# Patient Record
Sex: Male | Born: 1960 | Race: White | Hispanic: No | Marital: Married | Smoking: Former smoker
Health system: Southern US, Community
[De-identification: ages and names within clinical notes are randomized; demographics above are authoritative.]

## PROBLEM LIST (undated history)

## (undated) DIAGNOSIS — I10 Essential (primary) hypertension: Secondary | ICD-10-CM

## (undated) DIAGNOSIS — E079 Disorder of thyroid, unspecified: Secondary | ICD-10-CM

## (undated) DIAGNOSIS — H8109 Meniere's disease, unspecified ear: Secondary | ICD-10-CM

## (undated) DIAGNOSIS — M1990 Unspecified osteoarthritis, unspecified site: Secondary | ICD-10-CM

## (undated) DIAGNOSIS — E7800 Pure hypercholesterolemia, unspecified: Secondary | ICD-10-CM

## (undated) DIAGNOSIS — K219 Gastro-esophageal reflux disease without esophagitis: Secondary | ICD-10-CM

## (undated) DIAGNOSIS — F419 Anxiety disorder, unspecified: Secondary | ICD-10-CM

## (undated) HISTORY — DX: Gastro-esophageal reflux disease without esophagitis: K21.9

## (undated) HISTORY — PX: VASECTOMY: SHX75

## (undated) HISTORY — DX: Disorder of thyroid, unspecified: E07.9

## (undated) HISTORY — PX: HERNIA REPAIR: SHX51

## (undated) HISTORY — DX: Anxiety disorder, unspecified: F41.9

## (undated) HISTORY — DX: Unspecified osteoarthritis, unspecified site: M19.90

---

## 2021-08-20 LAB — CBC AND DIFFERENTIAL
HCT: 44 (ref 41–53)
Hemoglobin: 14.6 (ref 13.5–17.5)
Neutrophils Absolute: 4.3
Platelets: 210 10*3/uL (ref 150–400)
WBC: 7.1

## 2021-08-20 LAB — LIPID PANEL
Cholesterol: 171 (ref 0–200)
HDL: 40 (ref 35–70)
LDL Cholesterol: 118
Triglycerides: 64 (ref 40–160)

## 2021-08-20 LAB — COMPREHENSIVE METABOLIC PANEL
Albumin: 4.1 (ref 3.5–5.0)
Calcium: 9.3 (ref 8.7–10.7)
eGFR: 87

## 2021-08-20 LAB — HEPATIC FUNCTION PANEL
ALT: 10 U/L (ref 10–40)
AST: 13 — AB (ref 14–40)
Alkaline Phosphatase: 56 (ref 25–125)
Bilirubin, Total: 0.4

## 2021-08-20 LAB — BASIC METABOLIC PANEL
BUN: 16 (ref 4–21)
CO2: 24 — AB (ref 13–22)
Chloride: 105 (ref 99–108)
Creatinine: 0.9 (ref 0.6–1.3)
Glucose: 104
Potassium: 3.8 mEq/L (ref 3.5–5.1)
Sodium: 140 (ref 137–147)

## 2021-08-20 LAB — CBC: RBC: 5.27 — AB (ref 3.87–5.11)

## 2021-10-15 ENCOUNTER — Observation Stay (HOSPITAL_COMMUNITY)
Admission: EM | Admit: 2021-10-15 | Discharge: 2021-10-17 | Disposition: A | Discharge status: Home / Self Care | Payer: Medicare Other | Attending: Cardiology | Admitting: Cardiology

## 2021-10-15 ENCOUNTER — Emergency Department (HOSPITAL_COMMUNITY): Payer: Medicare Other

## 2021-10-15 ENCOUNTER — Other Ambulatory Visit: Payer: Self-pay

## 2021-10-15 ENCOUNTER — Encounter (HOSPITAL_COMMUNITY): Payer: Self-pay

## 2021-10-15 DIAGNOSIS — R0600 Dyspnea, unspecified: Secondary | ICD-10-CM | POA: Insufficient documentation

## 2021-10-15 DIAGNOSIS — N179 Acute kidney failure, unspecified: Secondary | ICD-10-CM | POA: Diagnosis not present

## 2021-10-15 DIAGNOSIS — Z87891 Personal history of nicotine dependence: Secondary | ICD-10-CM | POA: Insufficient documentation

## 2021-10-15 DIAGNOSIS — R0602 Shortness of breath: Secondary | ICD-10-CM | POA: Diagnosis present

## 2021-10-15 DIAGNOSIS — I7121 Aneurysm of the ascending aorta, without rupture: Secondary | ICD-10-CM | POA: Insufficient documentation

## 2021-10-15 DIAGNOSIS — I10 Essential (primary) hypertension: Secondary | ICD-10-CM | POA: Insufficient documentation

## 2021-10-15 DIAGNOSIS — E876 Hypokalemia: Secondary | ICD-10-CM | POA: Diagnosis not present

## 2021-10-15 DIAGNOSIS — Z79899 Other long term (current) drug therapy: Secondary | ICD-10-CM | POA: Diagnosis not present

## 2021-10-15 DIAGNOSIS — E0590 Thyrotoxicosis, unspecified without thyrotoxic crisis or storm: Secondary | ICD-10-CM | POA: Diagnosis not present

## 2021-10-15 DIAGNOSIS — Z20822 Contact with and (suspected) exposure to covid-19: Secondary | ICD-10-CM | POA: Insufficient documentation

## 2021-10-15 DIAGNOSIS — R7303 Prediabetes: Secondary | ICD-10-CM | POA: Diagnosis not present

## 2021-10-15 DIAGNOSIS — R0609 Other forms of dyspnea: Secondary | ICD-10-CM | POA: Diagnosis present

## 2021-10-15 DIAGNOSIS — I200 Unstable angina: Secondary | ICD-10-CM

## 2021-10-15 DIAGNOSIS — R002 Palpitations: Secondary | ICD-10-CM

## 2021-10-15 DIAGNOSIS — Z7982 Long term (current) use of aspirin: Secondary | ICD-10-CM | POA: Insufficient documentation

## 2021-10-15 HISTORY — DX: Essential (primary) hypertension: I10

## 2021-10-15 HISTORY — DX: Pure hypercholesterolemia, unspecified: E78.00

## 2021-10-15 LAB — HEPATIC FUNCTION PANEL
ALT: 22 U/L (ref 0–44)
AST: 26 U/L (ref 15–41)
Albumin: 3.6 g/dL (ref 3.5–5.0)
Alkaline Phosphatase: 58 U/L (ref 38–126)
Bilirubin, Direct: 0.2 mg/dL (ref 0.0–0.2)
Indirect Bilirubin: 0.7 mg/dL (ref 0.3–0.9)
Total Bilirubin: 0.9 mg/dL (ref 0.3–1.2)
Total Protein: 6.8 g/dL (ref 6.5–8.1)

## 2021-10-15 LAB — BASIC METABOLIC PANEL
Anion gap: 10 (ref 5–15)
BUN: 29 mg/dL — ABNORMAL HIGH (ref 8–23)
CO2: 23 mmol/L (ref 22–32)
Calcium: 9.5 mg/dL (ref 8.9–10.3)
Chloride: 106 mmol/L (ref 98–111)
Creatinine, Ser: 1.41 mg/dL — ABNORMAL HIGH (ref 0.61–1.24)
GFR, Estimated: 56 mL/min — ABNORMAL LOW (ref >60)
Glucose, Bld: 119 mg/dL — ABNORMAL HIGH (ref 70–99)
Potassium: 3.3 mmol/L — ABNORMAL LOW (ref 3.5–5.1)
Sodium: 139 mmol/L (ref 135–145)

## 2021-10-15 LAB — URINALYSIS, ROUTINE W REFLEX MICROSCOPIC
Bilirubin Urine: NEGATIVE
Glucose, UA: NEGATIVE mg/dL
Hgb urine dipstick: NEGATIVE
Ketones, ur: 5 mg/dL — AB
Leukocytes,Ua: NEGATIVE
Nitrite: NEGATIVE
Protein, ur: 30 mg/dL — AB
Specific Gravity, Urine: 1.028 (ref 1.005–1.030)
pH: 5 (ref 5.0–8.0)

## 2021-10-15 LAB — CBC
HCT: 43.2 % (ref 39.0–52.0)
Hemoglobin: 14.6 g/dL (ref 13.0–17.0)
MCH: 27.4 pg (ref 26.0–34.0)
MCHC: 33.8 g/dL (ref 30.0–36.0)
MCV: 81.2 fL (ref 80.0–100.0)
Platelets: 338 10*3/uL (ref 150–400)
RBC: 5.32 MIL/uL (ref 4.22–5.81)
RDW: 12.7 % (ref 11.5–15.5)
WBC: 8.4 10*3/uL (ref 4.0–10.5)
nRBC: 0 % (ref 0.0–0.2)

## 2021-10-15 LAB — PROTIME-INR
INR: 1.1 (ref 0.8–1.2)
Prothrombin Time: 14.6 seconds (ref 11.4–15.2)

## 2021-10-15 LAB — MAGNESIUM: Magnesium: 1.8 mg/dL (ref 1.7–2.4)

## 2021-10-15 LAB — BRAIN NATRIURETIC PEPTIDE: B Natriuretic Peptide: 70.8 pg/mL (ref 0.0–100.0)

## 2021-10-15 LAB — TROPONIN I (HIGH SENSITIVITY)
Troponin I (High Sensitivity): 24 ng/L — ABNORMAL HIGH (ref <18)
Troponin I (High Sensitivity): 23 ng/L — ABNORMAL HIGH (ref <18)

## 2021-10-15 LAB — RESP PANEL BY RT-PCR (FLU A&B, COVID) ARPGX2
Influenza A by PCR: NEGATIVE
Influenza B by PCR: NEGATIVE
SARS Coronavirus 2 by RT PCR: NEGATIVE

## 2021-10-15 LAB — LIPASE, BLOOD: Lipase: 80 U/L — ABNORMAL HIGH (ref 11–51)

## 2021-10-15 MED ORDER — SODIUM CHLORIDE 0.9 % IV SOLN: Freq: Once | INTRAVENOUS | Status: AC

## 2021-10-15 MED ORDER — HEPARIN BOLUS VIA INFUSION
4000.0000 [IU] | Freq: Once | INTRAVENOUS | Status: AC
  Administered 2021-10-15: 4000 [IU] via INTRAVENOUS
  Filled 2021-10-15: qty 4000

## 2021-10-15 MED ORDER — HEPARIN (PORCINE) 25000 UT/250ML-% IV SOLN
1500.0000 [IU]/h | INTRAVENOUS | Status: DC
  Administered 2021-10-15: 1300 [IU]/h via INTRAVENOUS
  Filled 2021-10-15 (×2): qty 250

## 2021-10-15 MED ORDER — POTASSIUM CHLORIDE CRYS ER 20 MEQ PO TBCR
40.0000 meq | EXTENDED_RELEASE_TABLET | Freq: Once | ORAL | Status: AC
  Administered 2021-10-15: 40 meq via ORAL
  Filled 2021-10-15: qty 2

## 2021-10-15 NOTE — ED Triage Notes (Signed)
Pt arrived POV from home c/o palpitations and SHOB x1-2 weeks. Pt states he also has felt dizzy and fatigued.  ?

## 2021-10-15 NOTE — ED Provider Notes (Signed)
?[facility] EM[facility]ENC[facility] ?Provider Note ? ? ?CSN: [id number] ?Arrival date & time: 10/15/21  1357 ? ?  ? ?History ? ?Chief Complaint  ?Patient presents with  ? Palpitations  ? Shortness of Breath  ? ? ?James Andersen is a 61 y.o. male with a past medical history of elevated BMI, hypertension, hyperlipidemia, 40-pack-year smoking history but quit 6 years ago who presents the emergency department chief complaint of exertional dyspnea.  Patient reports that for the past week he has noticed increasingly worsening exertional dyspnea.  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  He would also noticed palpitations which were new.  Patient reports that for instance yesterday he went [facility] and when he walked up a second flight of stairs he became extremely dizzy, short of breath, extremely nauseous, covered in cold sweat and had to sit down to improve his symptoms.  Patient also reports that his son-in-law who is a Psychologist, forensicCath Lab nurse was helping him build a lean to and he noted that he was becoming really short of breath whenever he would have to exert himself and told him that he really needed to get checked out.  He denies orthopnea, PND, unilateral or bilateral peripheral edema.  He has no family history of primarily relative with MI.  He denies a history of diabetes.  Patient does not drink alcohol regularly. ? ? ?Palpitations ?Associated symptoms: shortness of breath   ?Shortness of Breath ? ?HPI: A 61 year old patient with a history of hypertension, hypercholesterolemia and obesity presents for evaluation of chest pain. Initial onset of pain was approximately 3-6 hours ago. The patient's chest pain is worse with exertion. The patient complains of nausea and reports some diaphoresis. The patient's chest pain is middle- or  left-sided, is not well-localized, is not described as heaviness/pressure/tightness, is not sharp and does not radiate to the arms/jaw/neck. The patient has no history of stroke, has no history of peripheral artery disease, has not smoked in the past 90 days, denies any history of treated diabetes and has no relevant family history of coronary artery disease (first degree relative at less than age 455).  ? ?Home Medications ?Prior to Admission medications   ?Not on File  ?   ? ?Allergies    ?Patient has no known allergies.   ? ?Review of Systems   ?Review of Systems  ?Respiratory:  Positive for shortness of breath.   ?Cardiovascular:  Positive for palpitations.  ? ?Physical Exam ?Updated Vital Signs ?BP 136/81   Pulse 66   Temp 97.8 ?F (36.6 ?C) (Oral)   Resp 18   Ht 5\' 8"  (1.727 m)   Wt 106.6 kg   SpO2 99%   BMI 35.73 kg/m?  ?Physical Exam ?Vitals and nursing note reviewed.  ?Constitutional:   ?   General: He is not in acute distress. ?   Appearance: He is well-developed. He is not diaphoretic.  ?HENT:  ?   Head: Normocephalic and atraumatic.  ?Eyes:  ?   General: No scleral icterus. ?   Conjunctiva/sclera: Conjunctivae normal.  ?Cardiovascular:  ?   Rate and Rhythm: Normal rate and regular rhythm.  ?   Heart sounds: Normal heart sounds.  ?Pulmonary:  ?   Effort: Pulmonary effort is normal. No tachypnea or respiratory distress.  ?  Breath sounds: Normal breath sounds.  ?Abdominal:  ?   Palpations: Abdomen is soft.  ?   Tenderness: There is no abdominal tenderness.  ?Musculoskeletal:  ?   Cervical back: Normal range of motion and neck supple.  ?   Right lower leg: No edema.  ?   Left lower leg: No edema.  ?Skin: ?   General: Skin is warm and dry.  ?Neurological:  ?   Mental Status: He is alert.  ?Psychiatric:     ?   Behavior: Behavior normal.  ? ? ?ED Results / Procedures / Treatments   ?Labs ?(all labs ordered are listed, but only abnormal results are displayed) ?Labs Reviewed  ?BASIC METABOLIC PANEL -  Abnormal; Notable for the following components:  ?    Result Value  ? Potassium 3.3 (*)   ? Glucose, Bld 119 (*)   ? BUN 29 (*)   ? Creatinine, Ser 1.41 (*)   ? GFR, Estimated 56 (*)   ? All other components within normal limits  ?LIPASE, BLOOD - Abnormal; Notable for the following components:  ? Lipase 80 (*)   ? All other components within normal limits  ?URINALYSIS, ROUTINE W REFLEX MICROSCOPIC - Abnormal; Notable for the following components:  ? Color, Urine AMBER (*)   ? APPearance CLOUDY (*)   ? Ketones, ur 5 (*)   ? Protein, ur 30 (*)   ? Bacteria, UA FEW (*)   ? All other components within normal limits  ?TROPONIN I (HIGH SENSITIVITY) - Abnormal; Notable for the following components:  ? Troponin I (High Sensitivity) 23 (*)   ? All other components within normal limits  ?TROPONIN I (HIGH SENSITIVITY) - Abnormal; Notable for the following components:  ? Troponin I (High Sensitivity) 24 (*)   ? All other components within normal limits  ?RESP PANEL BY RT-PCR (FLU A&B, COVID) ARPGX2  ?CBC  ?MAGNESIUM  ?HEPATIC FUNCTION PANEL  ?BRAIN NATRIURETIC PEPTIDE  ?PROTIME-INR  ?TSH  ?HEPARIN LEVEL (UNFRACTIONATED)  ? ? ?EKG ?EKG Interpretation ? ?Date/Time:  Friday October 15 2021 14:11:21 EDT ?Ventricular Rate:  81 ?PR Interval:  160 ?QRS Duration: 84 ?QT Interval:  364 ?QTC Calculation: 422 ?R Axis:   -6 ?Text Interpretation: Sinus rhythm with marked sinus arrhythmia Nonspecific ST abnormality Abnormal ECG No previous ECGs available Confirmed by Pricilla Loveless [phone]) on 10/15/2021 9:48:20 PM ? ?Radiology ?DG Chest 2 View ? ?Result Date: 10/15/2021 ?CLINICAL DATA:  61 year old male with palpitations and shortness of breath. EXAM: CHEST - 2 VIEW COMPARISON:  None. FINDINGS: The mediastinal contours are within normal limits. No cardiomegaly. The lungs are clear bilaterally without evidence of focal consolidation, pleural effusion, or pneumothorax. No acute osseous abnormality. IMPRESSION: No acute cardiopulmonary process.  Electronically Signed   By: Marliss Coots M.D.   On: 10/15/2021 15:08   ? ?Procedures ?Marland KitchenCritical Care ?Performed by: Arthor Captain, PA-C ?Authorized by: Arthor Captain, PA-C  ? ?Critical care provider statement:  ?  Critical care time (minutes):  60 ?  Critical care time was exclusive of:  Separately billable procedures and treating other patients ?  Critical care was necessary to treat or prevent imminent or life-threatening deterioration of the following conditions:  Cardiac failure ?  Critical care was time spent personally by me on the following activities:  Development of treatment plan with patient or surrogate, discussions with consultants, evaluation of patient's response to treatment, examination of patient, ordering and review of laboratory studies, ordering and review of radiographic studies, ordering  and performing treatments and interventions, pulse oximetry, re-evaluation of patient's condition and review of old charts  ? ? ?Medications Ordered in ED ?Medications  ?heparin ADULT infusion 100 units/mL (25000 units/242mL) (1,300 Units/hr Intravenous New Bag/Given 10/15/21 2312)  ?0.9 %  sodium chloride infusion ( Intravenous New Bag/Given 10/15/21 2252)  ?potassium chloride SA (KLOR-CON M) CR tablet 40 mEq (40 mEq Oral Given 10/15/21 2254)  ?heparin bolus via infusion 4,000 Units (4,000 Units Intravenous Bolus from Bag 10/15/21 2312)  ? ? ?ED Course/ Medical Decision Making/ A&P ?  ?HEAR Score: 6                       ?Medical Decision Making ?Amount and/or Complexity of Data Reviewed ?Labs: ordered. ?Radiology: ordered. ? ? ? ? ? ?  ? ? ?This patient presents to the ED for concern of SOB/ palpitations, this involves an extensive number of treatment options, and is a complaint that carries with it a high risk of complications and morbidity.  The differential diagnosis includes The emergent differential diagnosis for shortness of breath includes, but is not limited to, Pulmonary edema, bronchoconstriction,  Pneumonia, Pulmonary embolism, Pneumotherax/ Hemothorax, Dysrythmia, ACS.  The differential diagnosis for palpitations includes cardiac arrhythmias, PVC/PAC, ACS, Cardiomyopathy, CHF, MVP, pericarditis, valvular

## 2021-10-15 NOTE — Progress Notes (Signed)
ANTICOAGULATION CONSULT NOTE - Initial Consult ? ?Pharmacy Consult for Heparin ?Indication: chest pain/ACS ? ?No Known Allergies ? ?Patient Measurements: ?Height: 5\' 8"  (172.7 cm) ?Weight: 106.6 kg (235 lb) ?IBW/kg (Calculated) : 68.4 ?Heparin Dosing Weight: 91.8 kg ? ?Vital Signs: ?Temp: 97.8 ?F (36.6 ?C) (03/24 1404) ?Temp Source: Oral (03/24 1404) ?BP: 136/81 (03/24 2114) ?Pulse Rate: 66 (03/24 2114) ? ?Labs: ?Recent Labs  ?  10/15/21 ?1357 10/15/21 ?1539 10/15/21 ?1541  ?HGB  --   --  14.6  ?HCT  --   --  43.2  ?PLT  --   --  338  ?CREATININE  --   --  1.41*  ?TROPONINIHS 24* 23*  --   ? ? ?Estimated Creatinine Clearance: 64.3 mL/min (A) (by C-G formula based on SCr of 1.41 mg/dL (H)). ? ? ?Medical History: ?Past Medical History:  ?Diagnosis Date  ? High cholesterol   ? Hypertension   ? ? ?Medications:  ?(Not in a hospital admission) ? ?Scheduled:  ? potassium chloride SA  40 mEq Oral Once  ? ?Infusions:  ? sodium chloride    ? ?PRN:  ? ?Assessment: ?59 yom with a history of HTN, HLD. Patient is presenting with palpitations & SOB. Heparin per pharmacy consult placed for chest pain/ACS. ? ?Patient is not on anticoagulation prior to arrival. ? ?Hgb 14.6; plt 338 ? ?Goal of Therapy:  ?Heparin level 0.3-0.7 units/ml ?Monitor platelets by anticoagulation protocol: Yes ?  ?Plan:  ?Give 4000 units bolus x 1 ?Start heparin infusion at 1300 units/hr ?Check anti-Xa level in 6 hours and daily while on heparin ?Continue to monitor H&H and platelets ? ?66, PharmD, BCPS ?10/15/2021 10:52 PM ?ED Clinical Pharmacist -  [phone] ? ?  ?

## 2021-10-15 NOTE — ED Provider Triage Note (Signed)
Emergency Medicine Provider Triage Evaluation Note ? ?James Andersen , a 61 y.o. male  was evaluated in triage.  Pt complains of multiple symptoms.  Most notably he started having palpitations several days ago.  He feels like his heart is rapidly beating and is irregular.  He says this is all the time.  He also has associated chest pressures.  He is in here with his wife reports he has had increased dizziness, shortness of breath.  Dizziness is mostly when he goes from a sitting to standing position.  He is having the palpitations now. ? ?Review of Systems  ?Positive: Chest pressure, palpitations, shortness of breath, dizziness, fatigue ?Negative:  ? ?Physical Exam  ?BP 127/83 (BP Location: Left Arm)   Pulse 71   Temp 97.8 ?F (36.6 ?C) (Oral)   Resp 17   Ht 5\' 8"  (1.727 m)   Wt 106.6 kg   SpO2 97%   BMI 35.73 kg/m?  ?Gen:   Awake, no distress   ?Resp:  Normal effort  ?MSK:   Moves extremities without difficulty  ?Other:   ? ?Medical Decision Making  ?Medically screening exam initiated at 2:36 PM.  Appropriate orders placed.  James Andersen was informed that the remainder of the evaluation will be completed by another provider, this initial triage assessment does not replace that evaluation, and the importance of remaining in the ED until their evaluation is complete. ? ? ?  ?Neomia Dear, PA-C ?10/15/21 1443 ? ?

## 2021-10-16 ENCOUNTER — Observation Stay (HOSPITAL_COMMUNITY): Payer: Medicare Other

## 2021-10-16 ENCOUNTER — Inpatient Hospital Stay (HOSPITAL_BASED_OUTPATIENT_CLINIC_OR_DEPARTMENT_OTHER): Payer: Medicare Other

## 2021-10-16 ENCOUNTER — Encounter (HOSPITAL_COMMUNITY): Payer: Self-pay | Admitting: Cardiology

## 2021-10-16 DIAGNOSIS — E0590 Thyrotoxicosis, unspecified without thyrotoxic crisis or storm: Principal | ICD-10-CM

## 2021-10-16 DIAGNOSIS — I200 Unstable angina: Secondary | ICD-10-CM

## 2021-10-16 DIAGNOSIS — R0609 Other forms of dyspnea: Secondary | ICD-10-CM | POA: Diagnosis present

## 2021-10-16 DIAGNOSIS — N179 Acute kidney failure, unspecified: Secondary | ICD-10-CM | POA: Diagnosis not present

## 2021-10-16 DIAGNOSIS — R0600 Dyspnea, unspecified: Secondary | ICD-10-CM | POA: Diagnosis not present

## 2021-10-16 DIAGNOSIS — Z20822 Contact with and (suspected) exposure to covid-19: Secondary | ICD-10-CM | POA: Diagnosis not present

## 2021-10-16 DIAGNOSIS — R0602 Shortness of breath: Secondary | ICD-10-CM | POA: Diagnosis present

## 2021-10-16 LAB — HIV ANTIBODY (ROUTINE TESTING W REFLEX): HIV Screen 4th Generation wRfx: NONREACTIVE

## 2021-10-16 LAB — HEPATIC FUNCTION PANEL
ALT: 22 U/L (ref 0–44)
AST: 21 U/L (ref 15–41)
Albumin: 3.3 g/dL — ABNORMAL LOW (ref 3.5–5.0)
Alkaline Phosphatase: 49 U/L (ref 38–126)
Bilirubin, Direct: 0.2 mg/dL (ref 0.0–0.2)
Indirect Bilirubin: 0.8 mg/dL (ref 0.3–0.9)
Total Bilirubin: 1 mg/dL (ref 0.3–1.2)
Total Protein: 6.1 g/dL — ABNORMAL LOW (ref 6.5–8.1)

## 2021-10-16 LAB — BASIC METABOLIC PANEL
Anion gap: 9 (ref 5–15)
BUN: 29 mg/dL — ABNORMAL HIGH (ref 8–23)
CO2: 22 mmol/L (ref 22–32)
Calcium: 8.8 mg/dL — ABNORMAL LOW (ref 8.9–10.3)
Chloride: 107 mmol/L (ref 98–111)
Creatinine, Ser: 1.1 mg/dL (ref 0.61–1.24)
GFR, Estimated: >60 mL/min (ref >60)
Glucose, Bld: 115 mg/dL — ABNORMAL HIGH (ref 70–99)
Potassium: 3 mmol/L — ABNORMAL LOW (ref 3.5–5.1)
Sodium: 138 mmol/L (ref 135–145)

## 2021-10-16 LAB — RAPID URINE DRUG SCREEN, HOSP PERFORMED
Amphetamines: NOT DETECTED
Barbiturates: NOT DETECTED
Benzodiazepines: POSITIVE — AB
Cocaine: NOT DETECTED
Opiates: NOT DETECTED
Tetrahydrocannabinol: POSITIVE — AB

## 2021-10-16 LAB — CBC
HCT: 37.9 % — ABNORMAL LOW (ref 39.0–52.0)
Hemoglobin: 12.8 g/dL — ABNORMAL LOW (ref 13.0–17.0)
MCH: 27.2 pg (ref 26.0–34.0)
MCHC: 33.8 g/dL (ref 30.0–36.0)
MCV: 80.6 fL (ref 80.0–100.0)
Platelets: 282 10*3/uL (ref 150–400)
RBC: 4.7 MIL/uL (ref 4.22–5.81)
RDW: 12.8 % (ref 11.5–15.5)
WBC: 6.8 10*3/uL (ref 4.0–10.5)
nRBC: 0 % (ref 0.0–0.2)

## 2021-10-16 LAB — CREATININE, URINE, RANDOM: Creatinine, Urine: 209.75 mg/dL

## 2021-10-16 LAB — ECHOCARDIOGRAM COMPLETE
Calc EF: 69.2 %
Height: 68 in
S' Lateral: 3.2 cm
Single Plane A2C EF: 60.3 %
Single Plane A4C EF: 76.7 %
Weight: 3568 oz

## 2021-10-16 LAB — HEPARIN LEVEL (UNFRACTIONATED): Heparin Unfractionated: 0.18 IU/mL — ABNORMAL LOW (ref 0.30–0.70)

## 2021-10-16 LAB — TSH: TSH: <0.01 u[IU]/mL — ABNORMAL LOW (ref 0.350–4.500)

## 2021-10-16 LAB — SODIUM, URINE, RANDOM: Sodium, Ur: 84 mmol/L

## 2021-10-16 LAB — T4, FREE: Free T4: 4.79 ng/dL — ABNORMAL HIGH (ref 0.61–1.12)

## 2021-10-16 MED ORDER — AMLODIPINE BESYLATE 5 MG PO TABS
5.0000 mg | ORAL_TABLET | Freq: Every day | ORAL | Status: DC
  Administered 2021-10-16 – 2021-10-17 (×2): 5 mg via ORAL
  Filled 2021-10-16 (×2): qty 1

## 2021-10-16 MED ORDER — NITROGLYCERIN 0.4 MG SL SUBL: 0.4000 mg | SUBLINGUAL_TABLET | SUBLINGUAL | Status: DC | PRN

## 2021-10-16 MED ORDER — METOPROLOL TARTRATE 25 MG PO TABS
37.5000 mg | ORAL_TABLET | Freq: Two times a day (BID) | ORAL | Status: DC
  Administered 2021-10-16 – 2021-10-17 (×2): 37.5 mg via ORAL
  Filled 2021-10-16 (×2): qty 1

## 2021-10-16 MED ORDER — ALBUTEROL SULFATE (2.5 MG/3ML) 0.083% IN NEBU
2.5000 mg | INHALATION_SOLUTION | Freq: Four times a day (QID) | RESPIRATORY_TRACT | Status: DC | PRN
  Filled 2021-10-16: qty 3

## 2021-10-16 MED ORDER — ONDANSETRON HCL 4 MG/2ML IJ SOLN: 4.0000 mg | Freq: Four times a day (QID) | INTRAMUSCULAR | Status: DC | PRN

## 2021-10-16 MED ORDER — ACETAMINOPHEN 325 MG PO TABS: 650.0000 mg | ORAL_TABLET | ORAL | Status: DC | PRN

## 2021-10-16 MED ORDER — ALPRAZOLAM 0.5 MG PO TABS
1.0000 mg | ORAL_TABLET | Freq: Every day | ORAL | Status: DC
  Administered 2021-10-16: 1 mg via ORAL
  Filled 2021-10-16: qty 2

## 2021-10-16 MED ORDER — ALPRAZOLAM 0.5 MG PO TABS
0.5000 mg | ORAL_TABLET | Freq: Once | ORAL | Status: AC
  Administered 2021-10-16: 0.5 mg via ORAL
  Filled 2021-10-16: qty 1

## 2021-10-16 MED ORDER — POTASSIUM CHLORIDE ER 10 MEQ PO TBCR
40.0000 meq | EXTENDED_RELEASE_TABLET | Freq: Once | ORAL | Status: AC
  Administered 2021-10-16: 40 meq via ORAL
  Filled 2021-10-16 (×2): qty 4

## 2021-10-16 MED ORDER — ASPIRIN EC 81 MG PO TBEC
81.0000 mg | DELAYED_RELEASE_TABLET | Freq: Every day | ORAL | Status: DC
  Administered 2021-10-16: 81 mg via ORAL
  Filled 2021-10-16 (×2): qty 1

## 2021-10-16 MED ORDER — POTASSIUM CHLORIDE CRYS ER 10 MEQ PO TBCR
40.0000 meq | EXTENDED_RELEASE_TABLET | Freq: Two times a day (BID) | ORAL | Status: DC
  Administered 2021-10-16 – 2021-10-17 (×2): 40 meq via ORAL
  Filled 2021-10-16 (×2): qty 4

## 2021-10-16 MED ORDER — METHIMAZOLE 5 MG PO TABS
5.0000 mg | ORAL_TABLET | Freq: Every day | ORAL | Status: DC
  Administered 2021-10-16 – 2021-10-17 (×2): 5 mg via ORAL
  Filled 2021-10-16 (×2): qty 1

## 2021-10-16 MED ORDER — METOPROLOL TARTRATE 25 MG PO TABS: 25.0000 mg | ORAL_TABLET | Freq: Two times a day (BID) | ORAL | Status: DC

## 2021-10-16 MED ORDER — ENOXAPARIN SODIUM 40 MG/0.4ML IJ SOSY
40.0000 mg | PREFILLED_SYRINGE | INTRAMUSCULAR | Status: DC
  Administered 2021-10-16: 40 mg via SUBCUTANEOUS
  Filled 2021-10-16: qty 0.4

## 2021-10-16 NOTE — H&P (Addendum)
?Cardiology Admission History and Physical:  ? ?[facility] ?MRN: [id number]; DOB: 12/22/1960  ? ?Admission date: 10/15/2021 ? ?PCP:  No primary care provider on file. ?  ?[facility] Providers ?Cardiologist:  Lorine BearsMuhammad Arida, MD      ? ? ?Chief Complaint:  Palpitations and shortness of breath ? ?Patient Profile:  ? ?James Andersen is a 61 y.o. male with a PMH of HTN, HLD, M?ni?re's disease, tobacco abuse (quit 6 years ago), obesity who is being seen 10/16/2021 for the evaluation of exertional shortness of breath. ? ?History of Present Illness:  ? ?James Andersen was in his usual state of health until approximately 1 month ago when he began experiencing several vague symptoms.  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  He reported issues with dyspnea on exertion, fatigue, feeling jittery, palpitations, dizziness, sweating, poor appetite, nausea, and weight loss.  He has not been having any chest pain.  [place].  Prior to this he followed with a PCP regularly and was last seen approximately 6 months ago in office.  He denies prior history of thyroid issues, CKD, diabetes, or pulmonary disease.  ROS notable for history of microscopic hematuria and occasional orthopnea.  He denies any fever, shortness of breath at rest, syncope, abdominal pain, PND, lower extremity edema. ? ?He denies any history of CAD.  No family history of CAD, reports both parents passed from cancer.  No personal or family history of autoimmune diseases.  He is a former smoker, quit about 6 years ago and smoked for 30 years. ? ?ED course: VSS. EKG showed sinus rhythm with sinus arrhythmia, rate 80 bpm, nonspecific ST-T wave abnormalities, no comparison.  Labs notable for K3.3, creatinine 1.41, lipase 80 otherwise LFTs WNL, CBC WNL, Trop 24>23, BNP 70.8,  TSH less than 0.01.  CXR with no acute findings.  He was given p.o. potassium supplement in the ED.  ED provider was concern for unstable angina and therefore consulted cardiology.  [facility] was on unassigned call 10/15/2021 with Dr. Odis Hollingsheadolia as excepting provider.  Patient has family contact with Dr. Kirke CorinArida and [facility].  He was started on a heparin drip in the ED. ? ? ?Past Medical History:  ?Diagnosis Date  ? High cholesterol   ? Hypertension   ? ? ?History reviewed. No pertinent surgical history.  ? ?Medications Prior to Admission: ?Prior to Admission medications   ?Medication Sig Start Date End Date Taking? Authorizing Provider  ?albuterol (VENTOLIN HFA) 108 (90 Base) MCG/ACT inhaler Inhale 1 puff into the lungs every 6 (six) hours as needed for wheezing or shortness of breath.   Yes [provider]  ?ALPRAZolam Prudy Feeler(XANAX) 1 MG tablet Take 1 mg by mouth at bedtime.   Yes [provider]  ?amLODipine (NORVASC) 5 MG tablet Take 5 mg by mouth in the morning and at bedtime.   Yes [provider]  ?aspirin EC 81 MG tablet Take 81 mg by mouth daily. Swallow whole.   Yes [provider]  ?hydrochlorothiazide (HYDRODIURIL) 25 MG tablet Take 25 mg by mouth daily.   Yes [provider]  ?lisinopril (ZESTRIL) 40 MG tablet Take 40 mg by mouth daily.   Yes [provider]  ?metoprolol tartrate (LOPRESSOR) 25 MG tablet Take 25 mg by mouth  2 (two) times daily.   Yes [provider]  ?  ? ?Allergies:   No Known Allergies ? ?Social History:   ?Social History  ? ?Socioeconomic History  ? Marital status: Married  ?  Spouse name: Not on file  ? Number of children: Not on file  ? Years of education: Not on file  ? Highest education level: Not on file  ?Occupational History  ? Not on file  ?Tobacco Use  ? Smoking status: Former  ?  Types: Cigarettes  ?  Quit date: 02/22/2017  ?  Years since quitting: 4.6  ? Smokeless tobacco: Never   ?Substance and Sexual Activity  ? Alcohol use: Not on file  ? Drug use: Not on file  ? Sexual activity: Not on file  ?Other Topics Concern  ? Not on file  ?Social History Narrative  ? Not on file  ? ?Social Determinants of Health  ? ?Financial Resource Strain: Not on file  ?Food Insecurity: Not on file  ?Transportation Needs: Not on file  ?Physical Activity: Not on file  ?Stress: Not on file  ?Social Connections: Not on file  ?Intimate Partner Violence: Not on file  ?  ?Family History:   ?The patient's family history includes Cancer in his father and mother.   ? ?ROS:  ?Please see the history of present illness.  ?All other ROS reviewed and negative.    ? ?Physical Exam/Data:  ? ?Vitals:  ? 10/16/21 0030 10/16/21 0610 10/16/21 0921 10/16/21 1157  ?BP: 128/80 127/73 136/76 128/78  ?Pulse: 86 78 78 84  ?Resp: 18 18 20 18   ?Temp: 98 ?F (36.7 ?C) 98.2 ?F (36.8 ?C) 97.8 ?F (36.6 ?C) 97.7 ?F (36.5 ?C)  ?TempSrc: Oral Oral Oral Oral  ?SpO2: 98% 97% 96% 97%  ?Weight: 101.2 kg     ?Height: 5\' 8"  (1.727 m)     ? ? ?Intake/Output Summary (Last 24 hours) at 10/16/2021 1537 ?Last data filed at 10/16/2021 1300 ?Gross per 24 hour  ?Intake 1043.38 ml  ?Output --  ?Net 1043.38 ml  ? ? ?  10/16/2021  ? 12:30 AM 10/15/2021  ?  2:13 PM  ?Last 3 Weights  ?Weight (lbs) 223 lb 235 lb  ?Weight (kg) 101.152 kg 106.595 kg  ?   ?Body mass index is 33.91 kg/m?.  ?General:  Well nourished, well developed, in no acute distress ?HEENT: Sclera anicteric ?Neck: no JVD, no palpable thyroid nodules ?Vascular: No carotid bruits; Distal pulses 2+ bilaterally   ?Cardiac:  normal S1, S2; RRR; no murmurs, rubs or gallops ?Lungs:  clear to auscultation bilaterally, no wheezing, rhonchi or rales  ?Abd: soft, nontender, no hepatomegaly  ?Ext: no edema ?Musculoskeletal:  No deformities, BUE and BLE strength normal and equal ?Skin: warm and dry  ?Neuro:  CNs 2-12 intact, no focal abnormalities noted ?Psych:  Normal affect  ? ? ?EKG:  sinus rhythm with sinus  arrhythmia, rate 80 bpm, nonspecific ST-T wave abnormalities, no comparison ? ?Relevant CV Studies: ?Echocardiogram pending ? ?Laboratory Data: ? ?High Sensitivity Troponin:   ?Recent Labs  ?Lab 10/15/21 ?1357 10/15/21 ?1539  ?TROPONINIHS 24* 23*  ?    ?Chemistry ?Recent Labs  ?Lab 10/15/21 ?1539 10/15/21 ?1541  ?NA  --  139  ?K  --  3.3*  ?CL  --  106  ?CO2  --  23  ?GLUCOSE  --  119*  ?BUN  --  29*  ?CREATININE  --  1.41*  ?CALCIUM  --  9.5  ?  MG 1.8  --   ?GFRNONAA  --  56*  ?ANIONGAP  --  10  ?  ?Recent Labs  ?Lab 10/15/21 ?1539  ?PROT 6.8  ?ALBUMIN 3.6  ?AST 26  ?ALT 22  ?ALKPHOS 58  ?BILITOT 0.9  ? ?Lipids No results for input(s): CHOL, TRIG, HDL, LABVLDL, LDLCALC, CHOLHDL in the last 168 hours. ?Hematology ?Recent Labs  ?Lab 10/15/21 ?1541  ?WBC 8.4  ?RBC 5.32  ?HGB 14.6  ?HCT 43.2  ?MCV 81.2  ?MCH 27.4  ?MCHC 33.8  ?RDW 12.7  ?PLT 338  ? ?Thyroid  ?Recent Labs  ?Lab 10/16/21 ?0443  ?TSH <0.010*  ? ?BNP ?Recent Labs  ?Lab 10/15/21 ?1542  ?BNP 70.8  ?  ?DDimer No results for input(s): DDIMER in the last 168 hours. ? ? ?Radiology/Studies:  ?No results found. ? ? ?Assessment and Plan:  ? ?1.  Dyspnea on exertion: This is been occurring for the past month in addition to several other vague complaints including fatigue, palpitations, jittery feeling, dizziness, nausea, poor appetite, sweating, weight loss.  He has not had any chest pain at rest or with activity.  EKG with nonspecific ST-T wave abnormalities and sinus arrhythmia, HsTrop 24>23. Cr 1.4 (baseline unknown).  CXR without acute findings.  Symptoms seem more consistent with hyperthyroidism in light of TSH <0.01 rather than unstable angina. ?- Will stop heparin gtt at this time ?- Will follow-up echocardiogram to evaluate LV function, wall motion, valve function. ?- Free T3/T4 added onto labs ?- Will check FLP and A1c in a.m. for evaluation of risk factors. ? ?2.  Undetectable TSH: Suspect this is driving his presentation.  TSH <0.01.  Free T3/T4 pending.   Medicine team has been consulted for assistance with work-up/management.  Planning to check a thyroid ultrasound. ?- Will defer further work-up/management to medicine ? ?3.  HTN: BP has been well controll

## 2021-10-16 NOTE — Care Management Obs Status (Signed)
MEDICARE OBSERVATION STATUS NOTIFICATION ? ? ?Patient Details  ?Name: James Andersen ?MRN: [id number] ?Date of Birth: 13-Dec-1960 ? ? ?Medicare Observation Status Notification Given:  Yes ? ?Permission to sign for notice given verbally over phone due to remote ? ?Lockie Pares, RN ?10/16/2021, 5:50 PM ?

## 2021-10-16 NOTE — Progress Notes (Signed)
K 3.0, paged Dr. Gaynelle Arabian.  ?

## 2021-10-16 NOTE — Progress Notes (Signed)
ANTICOAGULATION CONSULT NOTE  ? ?Pharmacy Consult for Heparin ?Indication: chest pain/ACS ? ?No Known Allergies ? ?Patient Measurements: ?Height: 5\' 8"  (172.7 cm) ?Weight: 101.2 kg (223 lb) ?IBW/kg (Calculated) : 68.4 ?Heparin Dosing Weight: 91.8 kg ? ?Vital Signs: ?Temp: 98 ?F (36.7 ?C) (03/25 0030) ?Temp Source: Oral (03/25 0030) ?BP: 128/80 (03/25 0030) ?Pulse Rate: 86 (03/25 0030) ? ?Labs: ?Recent Labs  ?  10/15/21 ?1357 10/15/21 ?1539 10/15/21 ?1541 10/15/21 ?2305 10/16/21 ?0443  ?HGB  --   --  14.6  --   --   ?HCT  --   --  43.2  --   --   ?PLT  --   --  338  --   --   ?LABPROT  --   --   --  14.6  --   ?INR  --   --   --  1.1  --   ?HEPARINUNFRC  --   --   --   --  0.18*  ?CREATININE  --   --  1.41*  --   --   ?TROPONINIHS 24* 23*  --   --   --   ? ? ? ?Estimated Creatinine Clearance: 62.6 mL/min (A) (by C-G formula based on SCr of 1.41 mg/dL (H)). ? ? ?Medical History: ?Past Medical History:  ?Diagnosis Date  ? High cholesterol   ? Hypertension   ? ? ?Medications:  ?No medications prior to admission.  ? ? ?Scheduled:  ? ? ?Infusions:  ? heparin 1,300 Units/hr (10/15/21 2312)  ? ?PRN:  ? ?Assessment: ?68 yom with a history of HTN, HLD. Patient is presenting with palpitations & SOB. Heparin per pharmacy consult placed for chest pain/ACS. ? ?Patient is not on anticoagulation prior to arrival. ? ?Hgb 14.6; plt 338 ? ?3/25 AM update:  ?Heparin level low  ? ?Goal of Therapy:  ?Heparin level 0.3-0.7 units/ml ?Monitor platelets by anticoagulation protocol: Yes ?  ?Plan:  ?Inc heparin to 1500 units/hr ?1400 heparin level ? ?4/25, PharmD, BCPS ?Clinical Pharmacist ?Phone: [phone] ? ?  ?

## 2021-10-16 NOTE — Progress Notes (Signed)
?  Echocardiogram ?2D Echocardiogram has been performed. ? ?Augustine Radar ?10/16/2021, 3:57 PM ?

## 2021-10-16 NOTE — Discharge Instructions (Signed)
Medicare Outpatient Observation Notice ?  ?Patient name:  James Andersen [facility] Patient number:  [id number]  ?                                                                                                                                                                     ?You?re a hospital outpatient receiving observation services. You are not an inpatient because: ? ?  ?palpitations, SHOB ?  ?Patient status is changed from inpatient to observation (outpatient)status as indicated under the Medicare Condition Code-44 Regulations, Chapter 100-04 of the Medicare Claims Processing Manual ?50.3. Date of Status Change  ?                                                                                                                                                                     ?  ?Being an outpatient may affect what you pay in a hospital: ?  ?When you?re a hospital outpatient, your observation stay is covered under Medicare Part B. ?  ?For Part B services, you generally pay: ?  ?A copayment for each outpatient hospital service you get. Part B copayments may vary by type of service. ?  ?20% of the Medicare-approved amount for most doctor services, after the Part B deductible. ?  ?Observation services may affect coverage and payment of your care after you leave the hospital: ? ?  ? ?If you need skilled nursing facility (SNF) care after you leave the hospital, Medicare Part A will only cover SNF care if you?ve had a 3-day minimum, medically necessary, inpatient hospital stay for a related illness or injury. An inpatient hospital stay begins the day the hospital admits you as an inpatient based on a doctor?s order and doesn?t include the day you?re discharged. ?  ?If you have Medicaid, a Medicare Advantage plan or other health plan, Medicaid or the plan may have different rules for SNF coverage after you leave the hospital. Check with Medicaid or your plan. ?  ?NOTE: Medicare Part A generally  doesn?t cover outpatient  hospital services, like an observation stay. However, Part A will generally cover medically necessary inpatient services if the hospital admits you as an inpatient based on a doctor?s order. In most cases, you?ll pay a one-time deductible for all of your inpatient hospital services for the first 60 days you?re in a hospital. ?                                                                                                                                                                     ?If you have any questions about your observation services, ask the hospital staff member giving you this notice or the doctor providing your hospital care. You can also ask to speak with someone from the hospital?s utilization or discharge planning department. ?  ?You can also call 1-800-MEDICARE ([phone]).  TTY users should call [phone]. ?  ?Form CMS 10611-MOON   Expiration 07/24/2021 OMB APPROVAL [id number]  ?  ?  ? ?  ? ?Your costs for medications: ? ?  ? ?Generally, prescription and over-the-counter drugs, including ?self-administered drugs,? you get in a hospital outpatient setting (like an emergency department) aren?t covered by Part B. ?Self- administered drugs? are drugs you?d normally take on your own. For safety reasons, many hospitals don?t allow you to take medications brought from home. If you have a Medicare prescription drug plan (Part D), your plan may help you pay for these drugs. You?ll likely need to pay out-of- pocket for these drugs and submit a claim to your drug plan for a refund. Contact your drug plan for more information. ?  ?                                                                                                                                                                     ?If you?re enrolled in a Medicare Advantage plan (like an HMO or PPO) or other Medicare health plan (Part C), your costs and coverage may be different. Check with your plan  to find out about coverage for  outpatient observation services. ? ?  ?If you?re a Science writer through your state Medicaid program, you can?t be billed for Part A or Part B deductibles, coinsurance, and copayments. ? ?                                                                                                                                                                    ?Additional Information (Optional): ?  ?  ?  ?  ?  ?                                                                                                                                                                     ?Please sign below to show you received and understand this notice. ? ?  ? ?                                    Date: 10/16/21 / Time:5:45 PM ?  ?CMS does not discriminate in its programs and activities. To request this publication in alternative format, please call: 1-800-MEDICARE or email:[email or web address]. ?  ?Form CMS 10611-MOON   Expiration 07/24/2021 OMB APPROVAL [id number]  ?  ? ? ?Patient ? ? ?Add ?No image attached ?Trace ?Slow ?Corrupt ?Edit Data ?Change Template ?Print ?On  ? ?

## 2021-10-16 NOTE — Progress Notes (Addendum)
Documentation. ? ?On October 15, 2021 [facility] was on for unassigned cardiology call. ? ?Received a call from ED physician assistant with regards to this 61 year old gentleman who has been experiencing palpitations and progressive shortness of breath. ? ?I was informed that the shortness of breath was getting progressively worse to the point yesterday after walking 2 flights of stairs he was dizzy, experiencing dyspnea, nauseated, diaphoretic and had to sit down for symptoms to resolve.  No chest pain or syncope. He was instructed to come to the ED for further evaluation and management. ? ?Initial EKG on 10/15/2021 noted normal sinus rhythm with sinus arrhythmia nonspecific ST changes. ? ?High sensitive troponins are slightly elevated but essentially flat - not suggestive of ACS. But symptoms still concerning for CAD.  ? ?Risk factors include hypertension, hyperlipidemia, obesity, former smoker. ? ?His symptoms were concerning for unstable angina and therefore cardiology was asked to admit by ED provider.  ? ?Tentative plan was to admit to progressive bed, started IV heparin per ACS protocol given the concern for unstable angina, echocardiogram with further recommendations to follow. ? ?Dr. Kirke Corin reached out this morning (10/16/2021) and requested if it would be okay to transfer [facility] care to their group at the family's request. ? ?I agreed and patient's care has been transferred to [facility].  ? ?No charge.  ? ?Tessa Lerner, DO, FACC ? ?Pager: [phone] ?Office: [phone] ? ? ? ?

## 2021-10-16 NOTE — Care Management CC44 (Signed)
Condition Code 44 Documentation Completed ? ?Patient Details  ?Name: Kurtis Anastasia [facility] ?MRN: [id number] ?Date of Birth: 30-Jun-1961 ? ? ?Condition Code 44 given:  Yes ?Patient signature on Condition Code 44 notice:  Yes ?Documentation of 2 MD's agreement:  Yes ?Code 44 added to claim:  Yes ? ?Permission to sign that received notice, given verbally via phone, Notice placed in DC instructions for print out at DC ? ?Lockie Pares, RN ?10/16/2021, 5:50 PM ? ?

## 2021-10-16 NOTE — Consult Note (Signed)
?History and Physical  ? ? ?James EssexReno Carl FrancisvilleBottarelli ZOX:[id number] DOB: 05/19/1961 DOA: 10/15/2021 ? ?PCP: No primary care provider on file. (Confirm with patient/family/NH records and if not entered, [facility]) ?Patient coming from: Home, consultation requested by cardiology ? ?I have personally briefly reviewed [facility] ? ?Chief Complaint: Palpitations, shortness of breath, feeling dizzy. ? ?HPI: Codey Neomia DearCarl Wiswell is a 61 y.o. 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 ? 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  He also has nonspecific symptoms including pain, fatigue.  Denies any night sweats, no diarrhea no significant weight loss. ? ?Yesterday while doing a gardening work, patient started to have a strong feeling of palpitations and feeling of about to pass out.  And feeling shortness of breath.  But he denies any chest pain, no fever or chills.  No history of thyroid problems.  No significant cardiac history.  He quit smoking several years ago. ? ?ED work-up showed troponin borderline elevated.  Cardiology was consulted, patient was admitted under cardiology service for work-up for CAD.  And possible new onset of CHF, initially work-up TSH less than 0.01.  T3 and T4 pending. ? ?Review of Systems: As per HPI otherwise 14 point review of systems negative.  ? ? ?Past Medical History:  ?Diagnosis Date  ? High cholesterol   ? Hypertension    ? ? ?History reviewed. No pertinent surgical history. ? ? reports that he quit smoking about 4 years ago. His smoking use included cigarettes. He has never used smokeless tobacco. No history on file for alcohol use and drug use. ? ?No Known Allergies ? ?Family History  ?Problem Relation Age of Onset  ? Cancer Mother   ? Cancer Father   ? ? ? ?Prior to Admission medications   ?Medication Sig Start Date End Date Taking? Authorizing Provider  ?albuterol (VENTOLIN HFA) 108 (90 Base) MCG/ACT inhaler Inhale 1 puff into the lungs every 6 (six) hours as needed for wheezing or shortness of breath.   Yes [provider]  ?ALPRAZolam Prudy Feeler(XANAX) 1 MG tablet Take 1 mg by mouth at bedtime.   Yes [provider]  ?amLODipine (NORVASC) 5 MG tablet Take 5 mg by mouth in the morning and at bedtime.   Yes [provider]  ?aspirin EC 81 MG tablet Take 81 mg by mouth daily. Swallow whole.   Yes [provider]  ?hydrochlorothiazide (HYDRODIURIL) 25 MG tablet Take 25 mg by mouth daily.   Yes [provider]  ?lisinopril (ZESTRIL) 40 MG tablet Take 40 mg by mouth daily.   Yes [provider]  ?metoprolol tartrate (LOPRESSOR) 25 MG tablet Take 25 mg by mouth 2 (two) times daily.   Yes [provider]  ? ? ?Physical Exam: ?Vitals:  ? 10/16/21 0030 10/16/21 0610 10/16/21 0921 10/16/21 1157  ?BP: 128/80 127/73 136/76 128/78  ?Pulse: 86 78 78 84  ?Resp: 18 18 20 18   ?Temp: 98 ?F (36.7 ?C) 98.2 ?F (36.8 ?  C) 97.8 ?F (36.6 ?C) 97.7 ?F (36.5 ?C)  ?TempSrc: Oral Oral Oral Oral  ?SpO2: 98% 97% 96% 97%  ?Weight: 101.2 kg     ?Height: 5\' 8"  (1.727 m)     ? ? ?Constitutional: NAD, calm, comfortable ?Vitals:  ? 10/16/21 0030 10/16/21 0610 10/16/21 0921 10/16/21 1157  ?BP: 128/80 127/73 136/76 128/78  ?Pulse: 86 78 78 84  ?Resp: 18 18 20 18   ?Temp: 98 ?F (36.7 ?C) 98.2 ?F (36.8 ?C) 97.8 ?F (36.6 ?C) 97.7 ?F (36.5 ?C)  ?TempSrc: Oral Oral Oral Oral  ?SpO2: 98% 97% 96% 97%  ?Weight: 101.2 kg      ?Height: 5\' 8"  (1.727 m)     ? ?Eyes: PERRL, lids and conjunctivae normal ?ENMT: Mucous membranes are moist. Posterior pharynx clear of any exudate or lesions.Normal dentition.  ?Neck: normal, supple, no masses, no thyromegaly ?Respiratory: clear to auscultation bilaterally, no wheezing, no crackles. Normal respiratory effort. No accessory muscle use.  ?Cardiovascular: Regular rate and rhythm, no murmurs / rubs / gallops. No extremity edema. 2+ pedal pulses. No carotid bruits.  ?Abdomen: no tenderness, no masses palpated. No hepatosplenomegaly. Bowel sounds positive.  ?Musculoskeletal: no clubbing / cyanosis. No joint deformity upper and lower extremities. Good ROM, no contractures. Normal muscle tone.  ?Skin: no rashes, lesions, ulcers. No induration ?Neurologic: CN 2-12 grossly intact. Sensation intact, DTR normal. Strength 5/5 in all 4.  ?Psychiatric: Normal judgment and insight. Alert and oriented x 3. Normal mood.  ? ? ? ?Labs on Admission: I have personally reviewed following labs and imaging studies ? ?CBC: ?Recent Labs  ?Lab 10/15/21 ?1541  ?WBC 8.4  ?HGB 14.6  ?HCT 43.2  ?MCV 81.2  ?PLT 338  ? ?Basic Metabolic Panel: ?Recent Labs  ?Lab 10/15/21 ?1539 10/15/21 ?1541  ?NA  --  139  ?K  --  3.3*  ?CL  --  106  ?CO2  --  23  ?GLUCOSE  --  119*  ?BUN  --  29*  ?CREATININE  --  1.41*  ?CALCIUM  --  9.5  ?MG 1.8  --   ? ?GFR: ?Estimated Creatinine Clearance: 62.6 mL/min (A) (by C-G formula based on SCr of 1.41 mg/dL (H)). ?Liver Function Tests: ?Recent Labs  ?Lab 10/15/21 ?1539  ?AST 26  ?ALT 22  ?ALKPHOS 58  ?BILITOT 0.9  ?PROT 6.8  ?ALBUMIN 3.6  ? ?Recent Labs  ?Lab 10/15/21 ?1539  ?LIPASE 80*  ? ?No results for input(s): AMMONIA in the last 168 hours. ?Coagulation Profile: ?Recent Labs  ?Lab 10/15/21 ?2305  ?INR 1.1  ? ?Cardiac Enzymes: ?No results for input(s): CKTOTAL, CKMB, CKMBINDEX, TROPONINI in the last 168 hours. ?BNP (last 3 results) ?No results for input(s): PROBNP in the last 8760 hours. ?HbA1C: ?No  results for input(s): HGBA1C in the last 72 hours. ?CBG: ?No results for input(s): GLUCAP in the last 168 hours. ?Lipid Profile: ?No results for input(s): CHOL, HDL, LDLCALC, TRIG, CHOLHDL, LDLDIRECT in the last 72 hours. ?Thyroid Function Tests: ?Recent Labs  ?  10/16/21 ?0443  ?TSH <0.010*  ? ?Anemia Panel: ?No results for input(s): VITAMINB12, FOLATE, FERRITIN, TIBC, IRON, RETICCTPCT in the last 72 hours. ?Urine analysis: ?   ?Component Value Date/Time  ? COLORURINE AMBER (A) 10/15/2021 1357  ? APPEARANCEUR CLOUDY (A) 10/15/2021 1357  ? LABSPEC 1.028 10/15/2021 1357  ? PHURINE 5.0 10/15/2021 1357  ? GLUCOSEU NEGATIVE 10/15/2021 1357  ? HGBUR NEGATIVE 10/15/2021 1357  ? BILIRUBINUR NEGATIVE 10/15/2021 1357  ? KETONESUR 5 (A)  10/15/2021 1357  ? PROTEINUR 30 (A) 10/15/2021 1357  ? NITRITE NEGATIVE 10/15/2021 1357  ? LEUKOCYTESUR NEGATIVE 10/15/2021 1357  ? ? ?Radiological Exams on Admission: ?DG Chest 2 View ? ?Result Date: 10/15/2021 ?CLINICAL DATA:  61 year old male with palpitations and shortness of breath. EXAM: CHEST - 2 VIEW COMPARISON:  None. FINDINGS: The mediastinal contours are within normal limits. No cardiomegaly. The lungs are clear bilaterally without evidence of focal consolidation, pleural effusion, or pneumothorax. No acute osseous abnormality. IMPRESSION: No acute cardiopulmonary process. Electronically Signed   By: Marliss Coots M.D.   On: 10/15/2021 15:08   ? ?EKG: Independently reviewed.  Sinus arrhythmia ? ?Assessment/Plan ?Principal Problem: ?  Unstable angina (HCC) ?Active Problems: ?  Dyspnea ? (please populate well all problems here in Problem List. (For example, if patient is on BP meds at home and you resume or decide to hold them, it is a problem that needs to be her. Same for CAD, COPD, HLD and so on) ? 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  Physical exam not showing enlarged thyroid,  check thyroid ultrasound. ?-Check hepatic function, CBC normal.  Start methimazole 5 mg daily.  Recheck TSH in 5 to 6 weeks, recommend outpatient follow-up with endocrinologist.  Educated about coming to ED if

## 2021-10-17 ENCOUNTER — Other Ambulatory Visit: Payer: Self-pay | Admitting: Medical

## 2021-10-17 DIAGNOSIS — E0590 Thyrotoxicosis, unspecified without thyrotoxic crisis or storm: Secondary | ICD-10-CM | POA: Diagnosis not present

## 2021-10-17 DIAGNOSIS — R0600 Dyspnea, unspecified: Secondary | ICD-10-CM

## 2021-10-17 DIAGNOSIS — N179 Acute kidney failure, unspecified: Secondary | ICD-10-CM

## 2021-10-17 DIAGNOSIS — I10 Essential (primary) hypertension: Secondary | ICD-10-CM

## 2021-10-17 DIAGNOSIS — E876 Hypokalemia: Secondary | ICD-10-CM

## 2021-10-17 DIAGNOSIS — Z20822 Contact with and (suspected) exposure to covid-19: Secondary | ICD-10-CM | POA: Diagnosis not present

## 2021-10-17 DIAGNOSIS — R7303 Prediabetes: Secondary | ICD-10-CM

## 2021-10-17 DIAGNOSIS — R002 Palpitations: Secondary | ICD-10-CM

## 2021-10-17 LAB — CBC
HCT: 36.8 % — ABNORMAL LOW (ref 39.0–52.0)
Hemoglobin: 12.6 g/dL — ABNORMAL LOW (ref 13.0–17.0)
MCH: 27.8 pg (ref 26.0–34.0)
MCHC: 34.2 g/dL (ref 30.0–36.0)
MCV: 81.2 fL (ref 80.0–100.0)
Platelets: 255 10*3/uL (ref 150–400)
RBC: 4.53 MIL/uL (ref 4.22–5.81)
RDW: 12.9 % (ref 11.5–15.5)
WBC: 7.9 10*3/uL (ref 4.0–10.5)
nRBC: 0 % (ref 0.0–0.2)

## 2021-10-17 LAB — BASIC METABOLIC PANEL
Anion gap: 8 (ref 5–15)
BUN: 27 mg/dL — ABNORMAL HIGH (ref 8–23)
CO2: 25 mmol/L (ref 22–32)
Calcium: 9 mg/dL (ref 8.9–10.3)
Chloride: 108 mmol/L (ref 98–111)
Creatinine, Ser: 0.96 mg/dL (ref 0.61–1.24)
GFR, Estimated: >60 mL/min (ref >60)
Glucose, Bld: 94 mg/dL (ref 70–99)
Potassium: 3.5 mmol/L (ref 3.5–5.1)
Sodium: 141 mmol/L (ref 135–145)

## 2021-10-17 LAB — LIPID PANEL
Cholesterol: 87 mg/dL (ref 0–200)
HDL: 24 mg/dL — ABNORMAL LOW (ref >40)
LDL Cholesterol: 48 mg/dL (ref 0–99)
Total CHOL/HDL Ratio: 3.6 RATIO
Triglycerides: 73 mg/dL (ref <150)
VLDL: 15 mg/dL (ref 0–40)

## 2021-10-17 LAB — HEMOGLOBIN A1C
Hgb A1c MFr Bld: 5.7 % — ABNORMAL HIGH (ref 4.8–5.6)
Mean Plasma Glucose: 116.89 mg/dL

## 2021-10-17 LAB — T3, FREE: T3, Free: 15.6 pg/mL — ABNORMAL HIGH (ref 2.0–4.4)

## 2021-10-17 MED ORDER — METOPROLOL TARTRATE 50 MG PO TABS
50.0000 mg | ORAL_TABLET | Freq: Two times a day (BID) | ORAL | 3 refills | Status: DC
Start: 2021-10-17 — End: 2022-01-10

## 2021-10-17 MED ORDER — POTASSIUM CHLORIDE CRYS ER 20 MEQ PO TBCR: 20.0000 meq | EXTENDED_RELEASE_TABLET | Freq: Every day | ORAL | 2 refills | Status: DC

## 2021-10-17 MED ORDER — METHIMAZOLE 5 MG PO TABS: 5.0000 mg | ORAL_TABLET | Freq: Every day | ORAL | 1 refills | Status: DC

## 2021-10-17 NOTE — Plan of Care (Signed)

## 2021-10-17 NOTE — Progress Notes (Signed)
?PROGRESS NOTE ? ? ? ?James Andersen  SNK:[id number] DOB: 1960/09/25 DOA: 10/15/2021 ?PCP: No primary care provider on file.  ? ? ?Brief Narrative:  ? ?James Andersen is a 61 y.o. 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  In the ED patient was noted to have elevated troponin.  Cardiology was consulted and was admitted hospital for further evaluation  ? ?Assessment and Plan: ? ?Palpitations shortness of breath could be secondary to hyperthyroidism.  Mildly elevated troponins secondary to tachycardia.  Cardiology on board.  2D echocardiogram with LV ejection fraction of 65 to 70%.  Cardiology to follow the patient as outpatient.  Currently normal sinus rhythm and rate controlled at this time. ? 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  No obvious goiter.  No previous history of thyroid problems.  Has been started on methimazole.  Recommend checking TSH in 4 to 6 weeks.  Will need CBC in 1 week.  Spoke with cardiology and patient about it.  Continue beta-blocker on discharge.  Spoke about sore throat and granulocytosis and complications of methimazole and the need for closer monitoring as outpatient with the patient and the family.  Patient would benefit from endocrinology follow-up as outpatient. ? ?Possible mild AKI on presentation.  CKD ruled out.  Has improved at this time. ?  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 ? ? ? DVT prophylaxis: enoxaparin (LOVENOX) injection 40 mg Start: 10/16/21 1800 ?SCDs Start: 10/16/21 1529 ? ? ?Code Status:   ?  Code Status: Full Code ? ? Family Communication: Spoke with the patient's family at bedside. ? ? ?Anti-infectives (From admission, onward)  ? ? None  ? ?  ? ?Subjective: ?Today, patient was seen and examined at bedside.  Feels better with breathing.  No chest pain dizziness  lightheadedness or palpitation. ? ?Objective: ?Vitals:  ? 10/16/21 0921 10/16/21 1157 10/16/21 2040 10/17/21 9379  ?BP: 136/76 128/78 (!) 154/97 127/84  ?Pulse: 78 84 100 77  ?Resp: 20 18 18 18   ?Temp: 97.8 ?F (36.6 ?C) 97.7 ?F (36.5 ?C) 98 ?F (36.7 ?C) 98.3 ?F (36.8 ?C)  ?TempSrc: Oral Oral Oral Oral  ?SpO2: 96% 97% 96% 97%  ?Weight:    99.5 kg  ?Height:      ? ? ?Intake/Output Summary (Last 24 hours) at 10/17/2021 1009 ?Last data filed at 10/16/2021 2200 ?Gross per 24 hour  ?Intake 683.38 ml  ?Output 200 ml  ?Net 483.38 ml  ? ?Filed Weights  ? 10/15/21 1413 10/16/21 0030 10/17/21 0635  ?Weight: 106.6 kg 101.2 kg 99.5 kg  ? ? ?Physical Examination: ? ?General:  Average built, not in obvious distress ?HENT:   No scleral pallor or icterus noted. Oral mucosa is moist.  ?Chest:  Clear breath sounds.  Diminished breath sounds bilaterally. No crackles or wheezes.  ?CVS: S1 &S2 heard. No murmur.  Regular rate and rhythm. ?Abdomen: Soft, nontender, nondistended.  Bowel sounds are heard.   ?Extremities: No cyanosis, clubbing or edema.  Peripheral pulses are palpable. ?Psych: Alert, awake and oriented, normal mood ?CNS:  No cranial nerve deficits.  Power equal in all extremities.   ?Skin: Warm and dry.  No rashes noted. ? ?Data Reviewed:  ? ?CBC: ?Recent Labs  ?Lab 10/15/21 ?1541 10/16/21 ?1549 10/17/21 ?0321  ?WBC 8.4 6.8 7.9  ?HGB 14.6 12.8* 12.6*  ?HCT 43.2 37.9* 36.8*  ?MCV 81.2 80.6 81.2  ?  PLT 338 282 255  ? ? ?Basic Metabolic Panel: ?Recent Labs  ?Lab 10/15/21 ?1539 10/15/21 ?1541 10/16/21 ?1549 10/17/21 ?0321  ?NA  --  139 138 141  ?K  --  3.3* 3.0* 3.5  ?CL  --  106 107 108  ?CO2  --  23 22 25   ?GLUCOSE  --  119* 115* 94  ?BUN  --  29* 29* 27*  ?CREATININE  --  1.41* 1.10 0.96  ?CALCIUM  --  9.5 8.8* 9.0  ?MG 1.8  --   --   --   ? ? ?Liver Function Tests: ?Recent Labs  ?Lab 10/15/21 ?1539 10/16/21 ?1549  ?AST 26 21  ?ALT 22 22  ?ALKPHOS 58 49  ?BILITOT 0.9 1.0  ?PROT 6.8 6.1*  ?ALBUMIN 3.6 3.3*  ? ? ? ?Radiology  Studies: ?DG Chest 2 View ? ?Result Date: 10/15/2021 ?CLINICAL DATA:  61 year old male with palpitations and shortness of breath. EXAM: CHEST - 2 VIEW COMPARISON:  None. FINDINGS: The mediastinal contours are within normal limits. No cardiomegaly. The lungs are clear bilaterally without evidence of focal consolidation, pleural effusion, or pneumothorax. No acute osseous abnormality. IMPRESSION: No acute cardiopulmonary process. Electronically Signed   By: 68 M.D.   On: 10/15/2021 15:08  ? ?ECHOCARDIOGRAM COMPLETE ? ?Result Date: 10/16/2021 ?   ECHOCARDIOGRAM REPORT   Patient Name:   James Andersen Date of Exam: 10/16/2021 Medical Rec #:  10/18/2021            Height:       68.0 in Accession #:    [id number]           Weight:       223.0 lb Date of Birth:  1960-09-11             BSA:          2.141 m? Patient Age:    60 years             BP:           128/78 mmHg Patient Gender: M                    HR:           79 bpm. Exam Location:  Inpatient Procedure: 2D Echo, Cardiac Doppler and Color Doppler Indications:    Dyspnea R06.00  History:        Patient has no prior history of Echocardiogram examinations.                 Risk Factors:Hypertension.  Sonographer:    02/24/1961 RDCS Referring Phys: Eulah Pont CELESTE C CANTWELL IMPRESSIONS  1. Left ventricular ejection fraction, by estimation, is 65 to 70%. The left ventricle has normal function. The left ventricle has no regional wall motion abnormalities. The left ventricular internal cavity size was mildly dilated. Left ventricular diastolic parameters were normal.  2. Right ventricular systolic function is normal. The right ventricular size is normal. There is mildly elevated pulmonary artery systolic pressure. The estimated right ventricular systolic pressure is 38.0 mmHg.  3. The mitral valve is normal in structure. Trivial mitral valve regurgitation.  4. The aortic valve is tricuspid. Aortic valve regurgitation is not visualized. Aortic valve  sclerosis/calcification is present, without any evidence of aortic stenosis.  5. Aortic dilatation noted. There is dilatation of the ascending aorta, measuring 40 mm.  6. The inferior vena cava is normal in size with greater than 50% respiratory variability, suggesting  right atrial pressure of 3 mmHg. FINDINGS  Left Ventricle: Left ventricular ejection fraction, by estimation, is 65 to 70%. The left ventricle has normal function. The left ventricle has no regional wall motion abnormalities. The left ventricular internal cavity size was mildly dilated. There is  no left ventricular hypertrophy. Left ventricular diastolic parameters were normal. Right Ventricle: The right ventricular size is normal. No increase in right ventricular wall thickness. Right ventricular systolic function is normal. There is mildly elevated pulmonary artery systolic pressure. The tricuspid regurgitant velocity is 2.96  m/s, and with an assumed right atrial pressure of 3 mmHg, the estimated right ventricular systolic pressure is 38.0 mmHg. Left Atrium: Left atrial size was normal in size. Right Atrium: Right atrial size was normal in size. Pericardium: There is no evidence of pericardial effusion. Mitral Valve: The mitral valve is normal in structure. Trivial mitral valve regurgitation. Tricuspid Valve: The tricuspid valve is normal in structure. Tricuspid valve regurgitation is trivial. Aortic Valve: The aortic valve is tricuspid. Aortic valve regurgitation is not visualized. Aortic valve sclerosis/calcification is present, without any evidence of aortic stenosis. Pulmonic Valve: The pulmonic valve was not well visualized. Pulmonic valve regurgitation is not visualized. Aorta: The aortic root is normal in size and structure and aortic dilatation noted. There is dilatation of the ascending aorta, measuring 40 mm. Venous: The inferior vena cava is normal in size with greater than 50% respiratory variability, suggesting right atrial pressure  of 3 mmHg. IAS/Shunts: The interatrial septum was not well visualized.  LEFT VENTRICLE PLAX 2D LVIDd:         5.90 cm LVIDs:         3.20 cm LV PW:         1.10 cm LV IVS:        1.00 cm LVOT diam:     2.20 cm LV SV:

## 2021-10-17 NOTE — TOC Transition Note (Signed)
Transition of Care (TOC) - CM/SW Discharge Note ?Donn Pierini Charity fundraiser, BSN ?Transitions of Care ?Unit 4E- RN Case Manager ?See Treatment Team for direct phone #  ? ? ?Patient Details  ?Name: James Andersen [facility] ?MRN: [id number] ?Date of Birth: 09-22-1960 ? ?Transition of Care (TOC) CM/SW Contact:  ?Zenda Alpers, Lenn Sink, RN ?Phone Number: ?10/17/2021, 10:07 AM ? ? ?Clinical Narrative:    ?Pt stable for transition home, request received for PCP needs. Pt recently moved here.  ?CM spoke with pt and wife at bedside- info provided for [facility] physician referral line and ways to go about finding local Primary care. Also answered questions about CC44 status change- and provided copy of CC44 that was reviewed with pt by [facility] staff 10/16/21.  ? ? ?Final next level of care: Home/Self Care ?Barriers to Discharge: No Barriers Identified ? ? ?Patient Goals and CMS Choice ?Patient states their goals for this hospitalization and ongoing recovery are:: return home ?  ?Choice offered to / list presented to : NA ? ?Discharge Placement ?  ?           ? home ?  ?  ?  ? ?Discharge Plan and Services ?In-house Referral: NA ?Discharge Planning Services: CM Consult, Other - See comment (PCP needs) ?Post Acute Care Choice: NA          ?DME Arranged: N/A ?DME Agency: NA ?  ?  ?  ?HH Arranged: NA ?HH Agency: NA ?  ?  ?  ? ?Social Determinants of Health (SDOH) Interventions ?  ? ? ?Readmission Risk Interventions ? ?  10/17/2021  ? 10:06 AM  ?Readmission Risk Prevention Plan  ?Post Dischage Appt Complete  ?Medication Screening Complete  ?Transportation Screening Complete  ? ? ? ? ? ?

## 2021-10-17 NOTE — Progress Notes (Signed)
? ?Progress Note ? ?Patient Name: James Andersen ?Date of Encounter: 10/17/2021 ? ?[facility] Cardiologist: Lorine BearsMuhammad Arida, MD  ? ?Subjective  ? ?BP 127/84.  Denies any chest pain or dyspnea ? ?Inpatient Medications  ?  ?Scheduled Meds: ? ALPRAZolam  1 mg Oral QHS  ? amLODipine  5 mg Oral Daily  ? aspirin EC  81 mg Oral Daily  ? enoxaparin (LOVENOX) injection  40 mg Subcutaneous Q24H  ? methimazole  5 mg Oral Daily  ? metoprolol tartrate  37.5 mg Oral BID  ? potassium chloride  40 mEq Oral BID  ? ?Continuous Infusions: ? ?PRN Meds: ?acetaminophen, albuterol, nitroGLYCERIN, ondansetron (ZOFRAN) IV  ? ?Vital Signs  ?  ?Vitals:  ? 10/16/21 0921 10/16/21 1157 10/16/21 2040 10/17/21 [id number]  ?BP: 136/76 128/78 (!) 154/97 127/84  ?Pulse: 78 84 100 77  ?Resp: 20 18 18 18   ?Temp: 97.8 ?F (36.6 ?C) 97.7 ?F (36.5 ?C) 98 ?F (36.7 ?C) 98.3 ?F (36.8 ?C)  ?TempSrc: Oral Oral Oral Oral  ?SpO2: 96% 97% 96% 97%  ?Weight:    99.5 kg  ?Height:      ? ? ?Intake/Output Summary (Last 24 hours) at 10/17/2021 0838 ?Last data filed at 10/16/2021 2200 ?Gross per 24 hour  ?Intake 1163.38 ml  ?Output 200 ml  ?Net 963.38 ml  ? ? ?  10/17/2021  ?  6:35 AM 10/16/2021  ? 12:30 AM 10/15/2021  ?  2:13 PM  ?Last 3 Weights  ?Weight (lbs) 219 lb 4.8 oz 223 lb 235 lb  ?Weight (kg) 99.474 kg 101.152 kg 106.595 kg  ?   ? ?Telemetry  ?  ?Normal sinus rhythm, PVCs- Personally Reviewed ? ?ECG  ?  ?Normal sinus rhythm, rate 70- Personally Reviewed ? ?Physical Exam  ? ?GEN: No acute distress.   ?Neck: No JVD ?Cardiac: RRR, no murmurs, rubs, or gallops.  ?Respiratory: Clear to auscultation bilaterally. ?GI: Soft, nontender, non-distended  ?MS: No edema; No deformity. ?Neuro:  Nonfocal  ?Psych: Normal affect  ? ?Labs  ?  ?High Sensitivity Troponin:   ?Recent Labs  ?Lab 10/15/21 ?1357 10/15/21 ?1539  ?TROPONINIHS 24* 23*  ?   ?Chemistry ?Recent Labs  ?Lab 10/15/21 ?1539 10/15/21 ?1541 10/16/21 ?1549 10/17/21 ?0321  ?NA  --  139 138 141  ?K  --  3.3* 3.0* 3.5   ?CL  --  106 107 108  ?CO2  --  23 22 25   ?GLUCOSE  --  119* 115* 94  ?BUN  --  29* 29* 27*  ?CREATININE  --  1.41* 1.10 0.96  ?CALCIUM  --  9.5 8.8* 9.0  ?MG 1.8  --   --   --   ?PROT 6.8  --  6.1*  --   ?ALBUMIN 3.6  --  3.3*  --   ?AST 26  --  21  --   ?ALT 22  --  22  --   ?ALKPHOS 58  --  49  --   ?BILITOT 0.9  --  1.0  --   ?GFRNONAA  --  56* >60 >60  ?ANIONGAP  --  10 9 8   ?  ?Lipids  ?Recent Labs  ?Lab 10/17/21 ?0321  ?CHOL 87  ?TRIG 73  ?HDL 24*  ?LDLCALC 48  ?CHOLHDL 3.6  ?  ?Hematology ?Recent Labs  ?Lab 10/15/21 ?1541 10/16/21 ?1549 10/17/21 ?0321  ?WBC 8.4 6.8 7.9  ?RBC 5.32 4.70 4.53  ?HGB 14.6 12.8* 12.6*  ?HCT 43.2 37.9* 36.8*  ?MCV  81.2 80.6 81.2  ?MCH 27.4 27.2 27.8  ?MCHC 33.8 33.8 34.2  ?RDW 12.7 12.8 12.9  ?PLT 338 282 255  ? ?Thyroid  ?Recent Labs  ?Lab 10/16/21 ?0443 10/16/21 ?1307  ?TSH <0.010*  --   ?FREET4  --  4.79*  ?  ?BNP ?Recent Labs  ?Lab 10/15/21 ?1542  ?BNP 70.8  ?  ?DDimer No results for input(s): DDIMER in the last 168 hours.  ? ?Radiology  ?  ?DG Chest 2 View ? ?Result Date: 10/15/2021 ?CLINICAL DATA:  61 year old male with palpitations and shortness of breath. EXAM: CHEST - 2 VIEW COMPARISON:  None. FINDINGS: The mediastinal contours are within normal limits. No cardiomegaly. The lungs are clear bilaterally without evidence of focal consolidation, pleural effusion, or pneumothorax. No acute osseous abnormality. IMPRESSION: No acute cardiopulmonary process. Electronically Signed   By: Marliss Coots M.D.   On: 10/15/2021 15:08  ? ?ECHOCARDIOGRAM COMPLETE ? ?Result Date: 10/16/2021 ?   ECHOCARDIOGRAM REPORT   Patient Name:   James Andersen Date of Exam: 10/16/2021 Medical Rec #:  [id number]            Height:       68.0 in Accession #:    [id number]           Weight:       223.0 lb Date of Birth:  February 20, 1961             BSA:          2.141 m? Patient Age:    60 years             BP:           128/78 mmHg Patient Gender: M                    HR:           79 bpm. Exam Location:   Inpatient Procedure: 2D Echo, Cardiac Doppler and Color Doppler Indications:    Dyspnea R06.00  History:        Patient has no prior history of Echocardiogram examinations.                 Risk Factors:Hypertension.  Sonographer:    Eulah Pont RDCS Referring Phys: [id number] CELESTE C CANTWELL IMPRESSIONS  1. Left ventricular ejection fraction, by estimation, is 65 to 70%. The left ventricle has normal function. The left ventricle has no regional wall motion abnormalities. The left ventricular internal cavity size was mildly dilated. Left ventricular diastolic parameters were normal.  2. Right ventricular systolic function is normal. The right ventricular size is normal. There is mildly elevated pulmonary artery systolic pressure. The estimated right ventricular systolic pressure is 38.0 mmHg.  3. The mitral valve is normal in structure. Trivial mitral valve regurgitation.  4. The aortic valve is tricuspid. Aortic valve regurgitation is not visualized. Aortic valve sclerosis/calcification is present, without any evidence of aortic stenosis.  5. Aortic dilatation noted. There is dilatation of the ascending aorta, measuring 40 mm.  6. The inferior vena cava is normal in size with greater than 50% respiratory variability, suggesting right atrial pressure of 3 mmHg. FINDINGS  Left Ventricle: Left ventricular ejection fraction, by estimation, is 65 to 70%. The left ventricle has normal function. The left ventricle has no regional wall motion abnormalities. The left ventricular internal cavity size was mildly dilated. There is  no left ventricular hypertrophy. Left ventricular diastolic parameters were normal. Right Ventricle: The right ventricular size  is normal. No increase in right ventricular wall thickness. Right ventricular systolic function is normal. There is mildly elevated pulmonary artery systolic pressure. The tricuspid regurgitant velocity is 2.96  m/s, and with an assumed right atrial pressure of 3 mmHg,  the estimated right ventricular systolic pressure is 38.0 mmHg. Left Atrium: Left atrial size was normal in size. Right Atrium: Right atrial size was normal in size. Pericardium: There is no evidence of pericardial effusion. Mitral Valve: The mitral valve is normal in structure. Trivial mitral valve regurgitation. Tricuspid Valve: The tricuspid valve is normal in structure. Tricuspid valve regurgitation is trivial. Aortic Valve: The aortic valve is tricuspid. Aortic valve regurgitation is not visualized. Aortic valve sclerosis/calcification is present, without any evidence of aortic stenosis. Pulmonic Valve: The pulmonic valve was not well visualized. Pulmonic valve regurgitation is not visualized. Aorta: The aortic root is normal in size and structure and aortic dilatation noted. There is dilatation of the ascending aorta, measuring 40 mm. Venous: The inferior vena cava is normal in size with greater than 50% respiratory variability, suggesting right atrial pressure of 3 mmHg. IAS/Shunts: The interatrial septum was not well visualized.  LEFT VENTRICLE PLAX 2D LVIDd:         5.90 cm LVIDs:         3.20 cm LV PW:         1.10 cm LV IVS:        1.00 cm LVOT diam:     2.20 cm LV SV:         86 LV SV Index:   40 LVOT Area:     3.80 cm?  LV Volumes (MOD) LV vol d, MOD A2C: 147.0 ml LV vol d, MOD A4C: 143.0 ml LV vol s, MOD A2C: 58.4 ml LV vol s, MOD A4C: 33.3 ml LV SV MOD A2C:     88.6 ml LV SV MOD A4C:     143.0 ml LV SV MOD BP:      101.1 ml RIGHT VENTRICLE TAPSE (M-mode): 2.2 cm LEFT ATRIUM             Index        RIGHT ATRIUM           Index LA diam:        4.70 cm 2.20 cm/m?   RA Area:     12.60 cm? LA Vol (A2C):   56.2 ml 26.26 ml/m?  RA Volume:   22.30 ml  10.42 ml/m? LA Vol (A4C):   50.7 ml 23.69 ml/m? LA Biplane Vol: 53.8 ml 25.13 ml/m?  AORTIC VALVE LVOT Vmax:   141.00 cm/s LVOT Vmean:  90.767 cm/s LVOT VTI:    0.227 m  AORTA Ao Root diam: 3.60 cm Ao Asc diam:  4.00 cm TRICUSPID VALVE TR Peak grad:   35.0 mmHg  TR Vmax:        296.00 cm/s  SHUNTS Systemic VTI:  0.23 m Systemic Diam: 2.20 cm Epifanio Lesches MD Electronically signed by Epifanio Lesches MD Signature Date/Time: 10/16/2021/7:17:54 PM    Final

## 2021-10-17 NOTE — Discharge Summary (Signed)
?Discharge Summary  ?  ?Patient ID: James Andersen [facility] ?MRN: [id number]; DOB: 03-May-1961 ? ?Admit date: 10/15/2021 ?Discharge date: 10/17/2021 ? ?PCP:  No primary care provider on file. ?  ?[facility] Providers ?Cardiologist:  Kathlyn Sacramento, MD      ? ? ?Discharge Diagnoses  ?  ?Principal Problem: ?  Hyperthyroidism without crisis ?Active Problems: ?  Dyspnea ?  Essential hypertension ?  Palpitations ?  AKI (acute kidney injury) ([place]) ?  Hypokalemia ?  Pre-diabetes ? ? ? ?Diagnostic Studies/Procedures  ?  ?Echocardiogram 10/16/21: ?1. Left ventricular ejection fraction, by estimation, is 65 to 70%. The  ?left ventricle has normal function. The left ventricle has no regional  ?wall motion abnormalities. The left ventricular internal cavity size was  ?mildly dilated. Left ventricular  ?diastolic parameters were normal.  ? 2. Right ventricular systolic function is normal. The right ventricular  ?size is normal. There is mildly elevated pulmonary artery systolic  ?pressure. The estimated right ventricular systolic pressure is 46.5 mmHg.  ? 3. The mitral valve is normal in structure. Trivial mitral valve  ?regurgitation.  ? 4. The aortic valve is tricuspid. Aortic valve regurgitation is not  ?visualized. Aortic valve sclerosis/calcification is present, without any  ?evidence of aortic stenosis.  ? 5. Aortic dilatation noted. There is dilatation of the ascending aorta,  ?measuring 40 mm.  ? 6. The inferior vena cava is normal in size with greater than 50%  ?respiratory variability, suggesting right atrial pressure of 3 mmHg.  ? ?Thyroid ultrasound 10/16/21: ?No discrete nodules are seen within the thyroid gland. Mildly ?prominent right neck lymph nodes not enlarged by imaging criteria. ?  ?IMPRESSION: ?No significant sonographic abnormality of the thyroid. ?_____________ ?  ?History of Present Illness   ?  ?Rontrell Dixie Jafri is a 61 y.o. 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 He was in his usual state of health until approximately 1 month ago when he began experiencing several vague symptoms.  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  He reported issues with dyspnea on exertion, fatigue, feeling jittery, palpitations, dizziness, sweating, poor appetite, nausea, and weight loss.  He has not been having any chest pain.  He moved from [place] to [place] in 05/2021.  Prior to this he followed with a PCP regularly and was last seen approximately 6 months ago in office.  He denies prior history of thyroid issues, CKD, diabetes, or pulmonary disease.  ROS notable for history of microscopic hematuria and occasional orthopnea.  He denies any fever, shortness of breath at rest, syncope, abdominal pain, PND, lower extremity edema. ?  ?He denies any history of CAD.  No family history of CAD, reports both parents passed from cancer.  No personal or family history of autoimmune diseases.  He is a former smoker, quit about 6 years ago and smoked for 30 years. ?  ?ED course: VSS. EKG showed sinus rhythm with sinus arrhythmia, rate 80 bpm, nonspecific ST-T wave abnormalities, no comparison.  Labs notable for K3.3, creatinine 1.41, lipase 80 otherwise LFTs WNL, CBC WNL, Trop 24>23, BNP 70.8, TSH less than 0.01.  CXR with no acute findings.  He was given p.o. potassium supplement in the ED.  ED provider was concern for unstable angina and therefore consulted cardiology.  [facility] was on unassigned  call 10/15/2021 with Dr. Terri Skains as excepting provider.  Patient has family contact with Dr. Fletcher Anon and requested for [facility] to assume care.  He was started on a heparin drip in the ED. ? ?Hospital Course  ?   ?Consultants: Medicine  ? ?1. Hyperthyroidism: patient presented with a month long history of DOE, fatigue, palpitations, jittery feeling,  dizziness, nausea, poor appetite, sweating, weight loss, though no complaints of chest pain. TSH < 0.010, Free T4 4.79. Medicine team consulted. Thyroid US was without goiter or nodules. He was started on methimazole 74m daily with plans to repeat a TSH/FT4 level in 4-6 weeks. ?- Continue methimazole 545mdaily ?- Will place referral to endocrinology at discharge for ongoing monitoring of newly diagnosed hyperthyroidism ?- Will need weekly CBC for 2 weeks to monitor for agranulocytosis - these have been ordered with instructions to [facility] [place] [facility] and 11/01/21.  ? ?2. Dyspnea on exertion: ongoing for the past month. No complaints of chest pain. EKG showed frequent PACs and non-specific ST-T wave abnormalities but no ischemia. HsTrop 24>23. Initially concerning for unstable angina until #1 became clear. He was briefly on a heparin gtt. Echocardiogram with EF 65-70%, normal LV diastolic function, mild LV dilation, no RWMA, normal RV size/function, no significant valvular abnormalities, and ascending aortic aneurysm (4031m No further inpatient cardiac work-up was recommended ?- Favor continuing management of #1 and if symptoms persist in 2 months, could consider ischemic testing at that time. ? ?3. Palpitations: also likely driven by #1. He does have frequent PACs for which his metoprolol was increased to 38m3mD.  ?- Continue metoprolol ?- Could consider an outpatient cardiac monitor if symptoms persist despite appropriate management of #1 ? ?4. AKI: Cr 1.41 on admission, down to 0.96 on day of discharge following IVFs. Unknown baseline but FeNa suggests pre-renal cause of elevated Cr. Home HCTZ and lisinopril were held on admission.  ? ?5. Hypokalemia: K trough 3.0. He was given po supplementation this admission. Home HCTZ discontinued.  ?- Discharged home on potassium 20 mEq with plans for repeat BMET in 1 week ? ?6. HTN: BP overall stable this admission ?- Continue amlodipine, metoprolol, and  resume home lisinopril ?- Will continue to hold HCTZ in light of hypokalemia. Could consider addition of spironolactone in the future ? ?7. Pre-Diabetes type 2: A1C 5.6 this admission ?- Continue to encourage dietary/lifestyle modifications to prevent progression ? ?8. Ascending aortic aneurysm: measured 40 mm on echo this admission. ?- Continue BP management as above ?- Anticipate surveillance imaging annually going forward.  ? ? ?Case management met with patient to provide information on establishing a PCP.  ? ? ?Did the patient have an acute coronary syndrome (MI, NSTEMI, STEMI, etc) this admission?:  No                               ?Did the patient have a percutaneous coronary intervention (stent / angioplasty)?:  No.   ? ?   ? ?  ?_____________ ? ?Discharge Vitals ?Blood pressure 127/84, pulse 77, temperature 98.3 ?F (36.8 ?C), temperature source Oral, resp. rate 18, height _0  (1.727 m), weight 99.5 kg, SpO2 97 %.  ?Filed Weights  ? 10/15/21 1413 10/16/21 0030 10/17/21 [id number]: 106.6 kg 101.2 kg 99.5 kg  ? ? ?Labs & Radiologic Studies  ?  ?CBC ?Recent Labs  ?  10/16/21 ?1549 10/17/21 ?0321  ?WBC 6.8 7.9  ?  HGB 12.8* 12.6*  ?HCT 37.9* 36.8*  ?MCV 80.6 81.2  ?PLT 282 255  ? ?Basic Metabolic Panel ?Recent Labs  ?  10/15/21 ?1539 10/15/21 ?1541 10/16/21 ?1549 10/17/21 ?0321  ?NA  --    < > 138 141  ?K  --    < > 3.0* 3.5  ?CL  --    < > 107 108  ?CO2  --    < > 22 25  ?GLUCOSE  --    < > 115* 94  ?BUN  --    < > 29* 27*  ?CREATININE  --    < > 1.10 0.96  ?CALCIUM  --    < > 8.8* 9.0  ?MG 1.8  --   --   --   ? < > = values in this interval not displayed.  ? ?Liver Function Tests ?Recent Labs  ?  10/15/21 ?1539 10/16/21 ?1549  ?AST 26 21  ?ALT 22 22  ?ALKPHOS 58 49  ?BILITOT 0.9 1.0  ?PROT 6.8 6.1*  ?ALBUMIN 3.6 3.3*  ? ?Recent Labs  ?  10/15/21 ?1539  ?LIPASE 80*  ? ?High Sensitivity Troponin:   ?Recent Labs  ?Lab 10/15/21 ?1357 10/15/21 ?1539  ?TROPONINIHS 24* 23*  ?  ?BNP ?Invalid input(s):  POCBNP ?D-Dimer ?No results for input(s): DDIMER in the last 72 hours. ?Hemoglobin A1C ?Recent Labs  ?  10/17/21 ?0321  ?HGBA1C 5.7*  ? ?Fasting Lipid Panel ?Recent Labs  ?  10/17/21 ?0321  ?CHOL 87  ?HDL 24*  ?LDLCAL

## 2021-10-18 ENCOUNTER — Telehealth: Payer: Self-pay | Admitting: Cardiovascular Disease

## 2021-10-18 NOTE — Telephone Encounter (Signed)
-----   Message from Beatriz Stallion, PA-C sent at 10/17/2021  9:41 AM EDT ----- ?Regarding: Hospital follow-up ?Hey there! Please schedule this patient for a follow-up appointment and call them with that information. ? ?Primary Cardiologist: Dr. Kirke Corin ?Date of Discharge: 10/17/2021 ?Appointment Needed Within: 2 months (non-urgent follow-up) ?Appointment Type: Hospital follow-up ? ?Thank you! ? ?Judy Pimple, PA-C ? ? ?

## 2021-10-18 NOTE — Telephone Encounter (Signed)
Attempted to schedule.  LMOV to call office.  ° °

## 2021-10-28 NOTE — Telephone Encounter (Signed)
Attempted to schedule.  LMOV to call office.  ° °

## 2021-11-17 NOTE — Telephone Encounter (Signed)
Noted  

## 2021-11-17 NOTE — Telephone Encounter (Signed)
No ans no vm .  ? ?3 attempts to schedule fu appt closing encounter.  ?

## 2021-11-19 NOTE — Progress Notes (Signed)
? ?New Patient Office Visit ? ?Subjective   ? ?Patient ID: James Andersen, male    DOB: 06/12/1961  Age: 61 y.o. MRN: [id number] ? ?CC:  ?Chief Complaint  ?Patient presents with  ? New Patient (Initial Visit)  ? ? ?HPI ?James Andersen presents to establish care. Patient relocated from [place] to [place] a few months ago. 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 Patient was hospitalized March 2023 for hyperthyroidism- new onset, and was started on methimazole 5 mg. Tolerating medication without issues. Patient reports most of his symptoms have improved except for the shortness of breath. States the increased dose of Metoprolol has helped with palpitations. Also taking amlodipine 5 mg and Lisinopril 40 mg for blood pressure. Takes alprazolam 1 mg every night to help with sleep. Patient reports previously was on statin which he did not tolerate well due to myalgia. ? ?Hospital discharge summary: ?Admit date: 10/15/2021 ?Discharge date: 10/17/2021 ?  ?PCP:  No primary care provider on file. ?             ?[facility] Providers ?Cardiologist:  Kathlyn Sacramento, MD      ?  ?  ?Discharge Diagnoses  ?  ?Principal Problem: ?  Hyperthyroidism without crisis ?Active Problems: ?  Dyspnea ?  Essential hypertension ?  Palpitations ?  AKI (acute kidney injury) ([place]) ?  Hypokalemia ?  Pre-diabetes ?  ?  ?  ?Diagnostic Studies/Procedures  ?  ?Echocardiogram 10/16/21: ?1. Left ventricular ejection fraction, by estimation, is 65 to 70%. The  ?left ventricle has normal function. The left ventricle has no regional  ?wall motion abnormalities. The left ventricular internal cavity size was  ?mildly dilated. Left ventricular  ?diastolic parameters were normal.  ? 2. Right ventricular systolic function is normal. The right ventricular  ?size is normal. There is mildly elevated pulmonary artery systolic  ?pressure. The estimated right ventricular systolic pressure is 99991111 mmHg.  ? 3. The mitral  valve is normal in structure. Trivial mitral valve  ?regurgitation.  ? 4. The aortic valve is tricuspid. Aortic valve regurgitation is not  ?visualized. Aortic valve sclerosis/calcification is present, without any  ?evidence of aortic stenosis.  ? 5. Aortic dilatation noted. There is dilatation of the ascending aorta,  ?measuring 40 mm.  ? 6. The inferior vena cava is normal in size with greater than 50%  ?respiratory variability, suggesting right atrial pressure of 3 mmHg.  ?  ?Thyroid ultrasound 10/16/21: ?No discrete nodules are seen within the thyroid gland. Mildly ?prominent right neck lymph nodes not enlarged by imaging criteria. ?  ?IMPRESSION: ?No significant sonographic abnormality of the thyroid. ?_____________ ?  ?History of Present Illness   ?  ?James Andersen is a 61 y.o. 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 He was in his usual state of health until approximately 1 month ago when he began experiencing several vague symptoms.  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  He reported issues with dyspnea on exertion, fatigue, feeling jittery, palpitations, dizziness, sweating, poor appetite, nausea, and weight loss.  He has not been having any chest pain.  He moved from [place] to [place] in 05/2021.  Prior to this he followed with a PCP regularly and was last seen approximately 6 months ago in office.  He denies prior history of thyroid issues, CKD, diabetes, or pulmonary disease.  ROS notable for history of microscopic hematuria and occasional orthopnea.  He denies any fever, shortness of breath at rest, syncope, abdominal pain, PND, lower extremity edema. ?  ?He denies any history of CAD.  No family history of CAD, reports both parents passed from cancer.  No personal or family history of autoimmune diseases.  He  is a former smoker, quit about 6 years ago and smoked for 30 years. ?  ?ED course: VSS. EKG showed sinus rhythm with sinus arrhythmia, rate 80 bpm, nonspecific ST-T wave abnormalities, no comparison.  Labs notable for K3.3, creatinine 1.41, lipase 80 otherwise LFTs WNL, CBC WNL, Trop 24>23, BNP 70.8, TSH less than 0.01.  CXR with no acute findings.  He was given p.o. potassium supplement in the ED.  ED provider was concern for unstable angina and therefore consulted cardiology.  [facility] was on unassigned call 10/15/2021 with Dr. Terri Skains as excepting provider.  Patient has family contact with Dr. Fletcher Anon and requested for [facility] to assume care.  He was started on a heparin drip in the ED. ?  ?Hospital Course  ?   ?Consultants: Medicine  ?  ?1. Hyperthyroidism: patient presented with a month long history of DOE, fatigue, palpitations, jittery feeling, dizziness, nausea, poor appetite, sweating, weight loss, though no complaints of chest pain. TSH < 0.010, Free T4 4.79. Medicine team consulted. Thyroid US was without goiter or nodules. He was started on methimazole 5mg  daily with plans to repeat a TSH/FT4 level in 4-6 weeks. ?- Continue methimazole 5mg  daily ?- Will place referral to endocrinology at discharge for ongoing monitoring of newly diagnosed hyperthyroidism ?- Will need weekly CBC for 2 weeks to monitor for agranulocytosis - these have been ordered with instructions to present to a [facility] facility 10/25/21 and 11/01/21.  ?  ?2. Dyspnea on exertion: ongoing for the past month. No complaints of chest pain. EKG showed frequent PACs and non-specific ST-T wave abnormalities but no ischemia. HsTrop 24>23. Initially concerning for unstable angina until #1 became clear. He was briefly on a heparin gtt. Echocardiogram with EF 65-70%, normal LV diastolic function, mild LV dilation, no RWMA, normal RV size/function, no significant valvular abnormalities, and ascending aortic aneurysm (31mm). No further  inpatient cardiac work-up was recommended ?- Favor continuing management of #1 and if symptoms persist in 2 months, could consider ischemic testing at that time. ?  ?3. Palpitations: also likely driven by #1. He does have frequent PACs for which his metoprolol was increased to 50mg  BID.  ?- Continue metoprolol ?- Could consider an outpatient cardiac monitor if symptoms persist despite appropriate management of #1 ?  ?4. AKI: Cr 1.41 on admission, down to 0.96 on day of discharge following IVFs. Unknown baseline but FeNa suggests pre-renal cause of elevated Cr. Home HCTZ and lisinopril were held on admission.  ?  ?5. Hypokalemia: K trough 3.0. He was given po supplementation this admission. Home HCTZ discontinued.  ?- Discharged home on potassium 20 mEq with plans for repeat BMET in 1 week ?  ?6. HTN: BP overall stable this admission ?- Continue amlodipine, metoprolol, and resume home lisinopril ?- Will continue to hold HCTZ in light of hypokalemia. Could consider addition of spironolactone in the future ?  ?7. Pre-Diabetes type 2: A1C 5.6 this admission ?- Continue to encourage dietary/lifestyle modifications to prevent progression ?  ?8. Ascending aortic aneurysm: measured 40 mm on echo this admission. ?- Continue  BP management as above ?- Anticipate surveillance imaging annually going forward.   ?  ? ?Outpatient Encounter Medications as of 11/22/2021  ?Medication Sig  ? albuterol (VENTOLIN HFA) 108 (90 Base) MCG/ACT inhaler Inhale 1 puff into the lungs every 6 (six) hours as needed for wheezing or shortness of breath.  ? ALPRAZolam (XANAX) 1 MG tablet Take 1 mg by mouth at bedtime.  ? amLODipine (NORVASC) 5 MG tablet Take 5 mg by mouth in the morning and at bedtime.  ? aspirin EC 81 MG tablet Take 81 mg by mouth daily. Swallow whole.  ? lisinopril (ZESTRIL) 40 MG tablet Take 40 mg by mouth daily.  ? methimazole (TAPAZOLE) 5 MG tablet Take 1 tablet (5 mg total) by mouth daily.  ? metoprolol tartrate (LOPRESSOR) 50 MG  tablet Take 1 tablet (50 mg total) by mouth 2 (two) times daily.  ? potassium chloride (KLOR-CON M) 20 MEQ tablet Take 1 tablet (20 mEq total) by mouth daily.  ? ?No facility-administered encounter med

## 2021-11-22 ENCOUNTER — Ambulatory Visit (INDEPENDENT_AMBULATORY_CARE_PROVIDER_SITE_OTHER): Payer: Medicare Other | Admitting: Physician Assistant

## 2021-11-22 ENCOUNTER — Encounter: Payer: Self-pay | Admitting: Physician Assistant

## 2021-11-22 VITALS — BP 150/79 | HR 64 | Temp 97.5°F | Resp 95 | Ht 68.0 in | Wt 230.0 lb

## 2021-11-22 DIAGNOSIS — E0590 Thyrotoxicosis, unspecified without thyrotoxic crisis or storm: Secondary | ICD-10-CM

## 2021-11-22 DIAGNOSIS — R002 Palpitations: Secondary | ICD-10-CM

## 2021-11-22 DIAGNOSIS — I7121 Aneurysm of the ascending aorta, without rupture: Secondary | ICD-10-CM

## 2021-11-22 DIAGNOSIS — R0600 Dyspnea, unspecified: Secondary | ICD-10-CM

## 2021-11-22 DIAGNOSIS — I10 Essential (primary) hypertension: Secondary | ICD-10-CM | POA: Diagnosis not present

## 2021-11-22 DIAGNOSIS — G4700 Insomnia, unspecified: Secondary | ICD-10-CM

## 2021-11-22 DIAGNOSIS — Z7689 Persons encountering health services in other specified circumstances: Secondary | ICD-10-CM

## 2021-11-22 DIAGNOSIS — I200 Unstable angina: Secondary | ICD-10-CM | POA: Diagnosis not present

## 2021-11-22 DIAGNOSIS — Z79899 Other long term (current) drug therapy: Secondary | ICD-10-CM

## 2021-11-22 NOTE — Patient Instructions (Signed)
Hyperthyroidism  Hyperthyroidism is when the thyroid gland is too active (overactive). The thyroid gland is a small gland located in the lower front part of the neck, just in front of the windpipe (trachea). This gland makes hormones that help control how the body uses food for energy (metabolism) as well as how the heart and brain function. These hormones also play a role in keeping your bones strong. When the thyroid is overactive, it produces too much of a hormone called thyroxine. What are the causes? This condition may be caused by: Graves' disease. This is a disorder in which the body's disease-fighting system (immune system) attacks the thyroid gland. This is the most common cause. Inflammation of the thyroid gland. A tumor in the thyroid gland. Use of certain medicines, including: Prescription thyroid hormone replacement. Herbal supplements that mimic thyroid hormones. Amiodarone therapy. Solid or fluid-filled lumps within your thyroid gland (thyroid nodules). Taking in a large amount of iodine from foods or medicines. What increases the risk? You are more likely to develop this condition if: You are male. You have a family history of thyroid conditions. You smoke tobacco. You use a medicine called lithium. You take medicines that affect the immune system (immunosuppressants). What are the signs or symptoms? Symptoms of this condition include: Nervousness. Inability to tolerate heat. Unexplained weight loss. Diarrhea. Change in the texture of hair or skin. Heart skipping beats or making extra beats. Rapid heart rate. Loss of menstruation. Shaky hands. Fatigue. Restlessness. Sleep problems. Enlarged thyroid gland or a lump in the thyroid (nodule). You may also have symptoms of Graves' disease, which may include: Protruding eyes. Dry eyes. Red or swollen eyes. Problems with vision. How is this diagnosed? This condition may be diagnosed based on: Your symptoms and  medical history. A physical exam. Blood tests. Thyroid ultrasound. This test involves using sound waves to produce images of the thyroid gland. A thyroid scan. A radioactive substance is injected into a vein, and images show how much iodine is present in the thyroid. Radioactive iodine uptake test (RAIU). A small amount of radioactive iodine is given by mouth to see how much iodine the thyroid absorbs after a certain amount of time. How is this treated? Treatment depends on the cause and severity of the condition. Treatment may include: Medicines to reduce the amount of thyroid hormone your body makes. Radioactive iodine treatment (radioiodine therapy). This involves swallowing a small dose of radioactive iodine, in capsule or liquid form, to kill thyroid cells. Surgery to remove part or all of your thyroid gland. You may need to take thyroid hormone replacement medicine for the rest of your life after thyroid surgery. Medicines to help manage your symptoms. Follow these instructions at home:  Take over-the-counter and prescription medicines only as told by your health care provider. Do not use any products that contain nicotine or tobacco, such as cigarettes and e-cigarettes. If you need help quitting, ask your health care provider. Follow any instructions from your health care provider about diet. You may be instructed to limit foods that contain iodine. Keep all follow-up visits as told by your health care provider. This is important. You will need to have blood tests regularly so that your health care provider can monitor your condition. Contact a health care provider if: Your symptoms do not get better with treatment. You have a fever. You are taking thyroid hormone replacement medicine and you: Have symptoms of depression. Feel like you are tired all the time. Gain weight. Get help   right away if: You have chest pain. You have decreased alertness or a change in your awareness. You  have abdominal pain. You feel dizzy. You have a rapid heartbeat. You have an irregular heartbeat. You have difficulty breathing. Summary The thyroid gland is a small gland located in the lower front part of the neck, just in front of the windpipe (trachea). Hyperthyroidism is when the thyroid gland is too active (overactive) and produces too much of a hormone called thyroxine. The most common cause is Graves' disease, a disorder in which your immune system attacks the thyroid gland. Hyperthyroidism can cause various symptoms, such as unexplained weight loss, nervousness, inability to tolerate heat, or changes in your heartbeat. Treatment may include medicine to reduce the amount of thyroid hormone your body makes, radioiodine therapy, surgery, or medicines to manage symptoms. This information is not intended to replace advice given to you by your health care provider. Make sure you discuss any questions you have with your health care provider. Document Revised: 07/23/2021 Document Reviewed: 03/26/2020 Elsevier Patient Education  2023 Elsevier Inc.  

## 2021-11-23 ENCOUNTER — Ambulatory Visit
Admission: RE | Admit: 2021-11-23 | Discharge: 2021-11-23 | Disposition: A | Discharge status: Home / Self Care | Payer: Medicare Other | Source: Ambulatory Visit | Attending: Physician Assistant | Admitting: Physician Assistant

## 2021-11-23 DIAGNOSIS — R0600 Dyspnea, unspecified: Secondary | ICD-10-CM

## 2021-11-23 LAB — CBC WITH DIFFERENTIAL/PLATELET
Basophils Absolute: 0.1 10*3/uL (ref 0.0–0.2)
Basos: 1 %
EOS (ABSOLUTE): 0.3 10*3/uL (ref 0.0–0.4)
Eos: 3 %
Hematocrit: 42.1 % (ref 37.5–51.0)
Hemoglobin: 13.8 g/dL (ref 13.0–17.7)
Immature Grans (Abs): 0 10*3/uL (ref 0.0–0.1)
Immature Granulocytes: 0 %
Lymphocytes Absolute: 2 10*3/uL (ref 0.7–3.1)
Lymphs: 23 %
MCH: 26.3 pg — ABNORMAL LOW (ref 26.6–33.0)
MCHC: 32.8 g/dL (ref 31.5–35.7)
MCV: 80 fL (ref 79–97)
Monocytes Absolute: 0.6 10*3/uL (ref 0.1–0.9)
Monocytes: 7 %
Neutrophils Absolute: 5.7 10*3/uL (ref 1.4–7.0)
Neutrophils: 66 %
Platelets: 285 10*3/uL (ref 150–450)
RBC: 5.24 x10E6/uL (ref 4.14–5.80)
RDW: 14.3 % (ref 11.6–15.4)
WBC: 8.7 10*3/uL (ref 3.4–10.8)

## 2021-11-23 LAB — COMPREHENSIVE METABOLIC PANEL
ALT: 13 IU/L (ref 0–44)
AST: 12 IU/L (ref 0–40)
Albumin/Globulin Ratio: 1.6 (ref 1.2–2.2)
Albumin: 4.3 g/dL (ref 3.8–4.8)
Alkaline Phosphatase: 93 IU/L (ref 44–121)
BUN/Creatinine Ratio: 10 (ref 10–24)
BUN: 8 mg/dL (ref 8–27)
Bilirubin Total: 0.6 mg/dL (ref 0.0–1.2)
CO2: 22 mmol/L (ref 20–29)
Calcium: 9.3 mg/dL (ref 8.6–10.2)
Chloride: 106 mmol/L (ref 96–106)
Creatinine, Ser: 0.79 mg/dL (ref 0.76–1.27)
Globulin, Total: 2.7 g/dL (ref 1.5–4.5)
Glucose: 95 mg/dL (ref 70–99)
Potassium: 3.6 mmol/L (ref 3.5–5.2)
Sodium: 142 mmol/L (ref 134–144)
Total Protein: 7 g/dL (ref 6.0–8.5)
eGFR: 100 mL/min/{1.73_m2} (ref >59)

## 2021-11-23 LAB — TSH: TSH: <0.005 u[IU]/mL — ABNORMAL LOW (ref 0.450–4.500)

## 2021-11-23 LAB — T4, FREE: Free T4: 2.16 ng/dL — ABNORMAL HIGH (ref 0.82–1.77)

## 2021-11-23 LAB — T3: T3, Total: 199 ng/dL — ABNORMAL HIGH (ref 71–180)

## 2021-11-24 ENCOUNTER — Other Ambulatory Visit: Payer: Self-pay | Admitting: Physician Assistant

## 2021-11-24 MED ORDER — ROSUVASTATIN CALCIUM 10 MG PO TABS: 10.0000 mg | ORAL_TABLET | Freq: Every day | ORAL | 0 refills | Status: DC

## 2021-12-22 ENCOUNTER — Encounter: Payer: Self-pay | Admitting: Cardiovascular Disease

## 2021-12-22 ENCOUNTER — Ambulatory Visit (INDEPENDENT_AMBULATORY_CARE_PROVIDER_SITE_OTHER): Payer: Medicare Other | Admitting: Cardiovascular Disease

## 2021-12-22 VITALS — BP 140/80 | HR 56 | Ht 68.0 in | Wt 233.4 lb

## 2021-12-22 DIAGNOSIS — R0609 Other forms of dyspnea: Secondary | ICD-10-CM | POA: Diagnosis not present

## 2021-12-22 DIAGNOSIS — R072 Precordial pain: Secondary | ICD-10-CM | POA: Diagnosis not present

## 2021-12-22 DIAGNOSIS — I200 Unstable angina: Secondary | ICD-10-CM

## 2021-12-22 DIAGNOSIS — I10 Essential (primary) hypertension: Secondary | ICD-10-CM | POA: Diagnosis not present

## 2021-12-22 NOTE — H&P (View-Only) (Signed)
Cardiology Office Note   Date:  12/22/2021   ID:  James Andersen, DOB November 03, 1960, MRN [id number]  PCP:  Mayer Masker, PA-C  Cardiologist:   Lorine Bears, MD   Chief Complaint  Patient presents with   Other    Angina/HTN c/o sob with exertion and leg/knee cramping. Meds reviewed verbally with pt.      History of Present Illness: James Andersen is a 61 y.o. male who was referred for evaluation of palpitations and shortness of breath.  He has past medical history of essential hypertension, hyperlipidemia, Mnire's disease, previous tobacco use and obesity. He was hospitalized recently at [facility] with fatigue, dyspnea on exertion, feeling jittery and palpitations.  In addition, he reported poor appetite, nausea and weight loss.  No chest pain.  He moved from [place] to [place] in November.  His work-up revealed mildly elevated troponin at 23 and BNP of 70.  His TSH was very low.  He had an echocardiogram done which showed normal LV systolic function, mild pulmonary hypertension and mildly dilated ascending aorta at 40 mm.  It was felt that his symptoms were related to hyperthyroidism.  He was treated with methimazole.  He had repeat testing done recently that showed that he is still hyperthyroid.  He reports improvement in symptoms overall but he is not back to baseline.  He continues to experience exertional dyspnea with normal everyday activities.  No significant chest discomfort.  He had CT scan for lung cancer screening done recently which showed no masses but he was found to have extensive three-vessel coronary artery calcifications and moderate aortic calcifications.  He was started on small dose aspirin and small dose rosuvastatin.  He reports intolerance to statins in the past due to back pain and myalgia.  He is tolerating rosuvastatin but has some myalgia symptoms.    Past Medical History:  Diagnosis Date   High cholesterol    Hypertension    Thyroid disease      Past Surgical History:  Procedure Laterality Date   HERNIA REPAIR     VASECTOMY       Current Outpatient Medications  Medication Sig Dispense Refill   albuterol (VENTOLIN HFA) 108 (90 Base) MCG/ACT inhaler Inhale 1 puff into the lungs every 6 (six) hours as needed for wheezing or shortness of breath.     ALPRAZolam (XANAX) 1 MG tablet Take 1 mg by mouth at bedtime.     amLODipine (NORVASC) 5 MG tablet Take 5 mg by mouth in the morning and at bedtime.     aspirin EC 81 MG tablet Take 81 mg by mouth daily. Swallow whole.     lisinopril (ZESTRIL) 40 MG tablet Take 40 mg by mouth daily.     methimazole (TAPAZOLE) 5 MG tablet Take 1 tablet (5 mg total) by mouth daily. (Patient taking differently: Take 10 mg by mouth daily.) 90 tablet 1   metoprolol tartrate (LOPRESSOR) 50 MG tablet Take 1 tablet (50 mg total) by mouth 2 (two) times daily. 180 tablet 3   potassium chloride (KLOR-CON M) 20 MEQ tablet Take 1 tablet (20 mEq total) by mouth daily. 30 tablet 2   rosuvastatin (CRESTOR) 10 MG tablet Take 1 tablet (10 mg total) by mouth daily. 90 tablet 0   No current facility-administered medications for this visit.    Allergies:   Patient has no known allergies.    Social History:  The patient  reports that he quit smoking about 4 years ago.  His smoking use included cigarettes. He has a 30.00 pack-year smoking history. He has never used smokeless tobacco. He reports that he does not currently use alcohol. He reports current drug use. Drug: Marijuana.   Family History:  The patient's family history includes Cancer in his father and mother.    ROS:  Please see the history of present illness.   Otherwise, review of systems are positive for none.   All other systems are reviewed and negative.    PHYSICAL EXAM: VS:  BP 140/80 (BP Location: Right Arm, Patient Position: Sitting, Cuff Size: Normal)   Pulse (!) 56   Ht 5\' 8"  (1.727 m)   Wt 233 lb 7 oz (105.9 kg)   SpO2 98%   BMI 35.49 kg/m   , BMI Body mass index is 35.49 kg/m. GEN: Well nourished, well developed, in no acute distress  HEENT: normal  Neck: no JVD, carotid bruits, or masses Cardiac: RRR; no murmurs, rubs, or gallops,no edema  Respiratory:  clear to auscultation bilaterally, normal work of breathing GI: soft, nontender, nondistended, + BS MS: no deformity or atrophy  Skin: warm and dry, no rash Neuro:  Strength and sensation are intact Psych: euthymic mood, full affect   EKG:  EKG is ordered today. The ekg ordered today demonstrates sinus bradycardia with PACs.   Recent Labs: 10/15/2021: B Natriuretic Peptide 70.8; Magnesium 1.8 11/22/2021: ALT 13; BUN 8; Creatinine, Ser 0.79; Hemoglobin 13.8; Platelets 285; Potassium 3.6; Sodium 142; TSH <0.005    Lipid Panel    Component Value Date/Time   CHOL 87 10/17/2021 0321   TRIG 73 10/17/2021 0321   HDL 24 (L) 10/17/2021 0321   CHOLHDL 3.6 10/17/2021 0321   VLDL 15 10/17/2021 0321   LDLCALC 48 10/17/2021 0321      Wt Readings from Last 3 Encounters:  12/22/21 233 lb 7 oz (105.9 kg)  11/22/21 230 lb (104.3 kg)  10/17/21 219 lb 4.8 oz (99.5 kg)          12/22/2021    2:05 PM  PAD Screen  Previous PAD dx? No  Previous surgical procedure? No  Pain with walking? No  Feet/toe relief with dangling? Yes  Painful, non-healing ulcers? No  Extremities discolored? No      ASSESSMENT AND PLAN:  1.  Exertional dyspnea: Concerning for possible angina equivalent considering degree of coronary artery calcifications noted on recent CT scan.  I agree with low-dose aspirin and a statin.  I recommend evaluation with a treadmill nuclear stress test to make sure he has no obstructive disease.  2.  Hyperthyroidism: He is still hyperthyroid according to most recent testing.  He has an upcoming appointment scheduled with endocrinology.    3.  Essential hypertension: Blood pressure is controlled.  I agree with metoprolol given that he is hyperthyroid.  4.  Mildly  dilated ascending aorta.  Continue blood pressure control.    Disposition:   FU with me in 4 to 6 weeks.  Signed,  12/24/2021, MD  12/22/2021 5:45 PM    [facility]

## 2021-12-22 NOTE — Patient Instructions (Signed)
Medication Instructions:  Your physician recommends that you continue on your current medications as directed. Please refer to the Current Medication list given to you today.  *If you need a refill on your cardiac medications before your next appointment, please call your pharmacy*   Lab Work: None ordered If you have labs (blood work) drawn today and your tests are completely normal, you will receive your results only by: MyChart Message (if you have MyChart) OR A paper copy in the mail If you have any lab test that is abnormal or we need to change your treatment, we will call you to review the results.   Testing/Procedures:  [facility] MYOVIEW     Your caregiver has ordered a Stress Test with nuclear imaging. The purpose of this test is to evaluate the blood supply to your heart muscle. This procedure is referred to as a "Non-Invasive Stress Test." This is because other than having an IV started in your vein, nothing is inserted or "invades" your body. Cardiac stress tests are done to find areas of poor blood flow to the heart by determining the extent of coronary artery disease (CAD). Some patients exercise on a treadmill, which naturally increases the blood flow to your heart, while others who are  unable to walk on a treadmill due to physical limitations have a pharmacologic/chemical stress agent called Lexiscan . This medicine will mimic walking on a treadmill by temporarily increasing your coronary blood flow.      PLEASE REPORT TO [facility] MEDICAL MALL ENTRANCE   THE VOLUNTEERS AT THE FIRST DESK WILL DIRECT YOU WHERE TO GO     *Please note: these test may take anywhere between 2-4 hours to complete       Date of Procedure:_____________________________________   Arrival Time for Procedure:______________________________    PLEASE NOTIFY THE OFFICE AT LEAST 24 HOURS IN ADVANCE IF YOU ARE UNABLE TO KEEP YOUR APPOINTMENT.  [phone]  PLEASE NOTIFY NUCLEAR MEDICINE AT [facility] AT LEAST 24  HOURS IN ADVANCE IF YOU ARE UNABLE TO KEEP YOUR APPOINTMENT. [phone]      How to prepare for your Myoview test:    __XX__:  Hold betablocker(s) night before procedure and morning of procedure metoprolol tartrate (LOPRESSOR)    1. Do not eat or drink after midnight  2. No caffeine for 24 hours prior to test  3. No smoking 24 hours prior to test.  4. Unless instructed otherwise, Take your medication with a small sips of water.    5.         Ladies, please do not wear dresses. Skirts or pants are appropriate. Please wear a short sleeve shirt.  6. No perfume, cologne or lotion.  7. Wear comfortable walking shoes. No heels!     Follow-Up: At [facility], you and your health needs are our priority.  As part of our continuing mission to provide you with exceptional heart care, we have created designated Provider Care Teams.  These Care Teams include your primary Cardiologist (physician) and Advanced Practice Providers (APPs -  Physician Assistants and Nurse Practitioners) who all work together to provide you with the care you need, when you need it.  We recommend signing up for the patient portal called "MyChart".  Sign up information is provided on this After Visit Summary.  MyChart is used to connect with patients for Virtual Visits (Telemedicine).  Patients are able to view lab/test results, encounter notes, upcoming appointments, etc.  Non-urgent messages can be sent to your provider as  well.   To learn more about what you can do with MyChart, go to [email or web address].    Your next appointment:   1 month(s)  The format for your next appointment:   In Person  Provider:   You may see Lorine Bears, MD or one of the following Advanced Practice Providers on your designated Care Team:   Nicolasa Ducking, NP Eula Listen, PA-C Cadence Fransico Michael, [place]    Other Instructions   Important Information About Sugar

## 2021-12-22 NOTE — Progress Notes (Signed)
Cardiology Office Note   Date:  12/22/2021   ID:  James Andersen, DOB November 03, 1960, MRN [id number]  PCP:  Mayer Masker, PA-C  Cardiologist:   Lorine Bears, MD   Chief Complaint  Patient presents with   Other    Angina/HTN c/o sob with exertion and leg/knee cramping. Meds reviewed verbally with pt.      History of Present Illness: James Andersen is a 61 y.o. male who was referred for evaluation of palpitations and shortness of breath.  He has past medical history of essential hypertension, hyperlipidemia, Mnire's disease, previous tobacco use and obesity. He was hospitalized recently at [facility] with fatigue, dyspnea on exertion, feeling jittery and palpitations.  In addition, he reported poor appetite, nausea and weight loss.  No chest pain.  He moved from [place] to [place] in November.  His work-up revealed mildly elevated troponin at 23 and BNP of 70.  His TSH was very low.  He had an echocardiogram done which showed normal LV systolic function, mild pulmonary hypertension and mildly dilated ascending aorta at 40 mm.  It was felt that his symptoms were related to hyperthyroidism.  He was treated with methimazole.  He had repeat testing done recently that showed that he is still hyperthyroid.  He reports improvement in symptoms overall but he is not back to baseline.  He continues to experience exertional dyspnea with normal everyday activities.  No significant chest discomfort.  He had CT scan for lung cancer screening done recently which showed no masses but he was found to have extensive three-vessel coronary artery calcifications and moderate aortic calcifications.  He was started on small dose aspirin and small dose rosuvastatin.  He reports intolerance to statins in the past due to back pain and myalgia.  He is tolerating rosuvastatin but has some myalgia symptoms.    Past Medical History:  Diagnosis Date   High cholesterol    Hypertension    Thyroid disease      Past Surgical History:  Procedure Laterality Date   HERNIA REPAIR     VASECTOMY       Current Outpatient Medications  Medication Sig Dispense Refill   albuterol (VENTOLIN HFA) 108 (90 Base) MCG/ACT inhaler Inhale 1 puff into the lungs every 6 (six) hours as needed for wheezing or shortness of breath.     ALPRAZolam (XANAX) 1 MG tablet Take 1 mg by mouth at bedtime.     amLODipine (NORVASC) 5 MG tablet Take 5 mg by mouth in the morning and at bedtime.     aspirin EC 81 MG tablet Take 81 mg by mouth daily. Swallow whole.     lisinopril (ZESTRIL) 40 MG tablet Take 40 mg by mouth daily.     methimazole (TAPAZOLE) 5 MG tablet Take 1 tablet (5 mg total) by mouth daily. (Patient taking differently: Take 10 mg by mouth daily.) 90 tablet 1   metoprolol tartrate (LOPRESSOR) 50 MG tablet Take 1 tablet (50 mg total) by mouth 2 (two) times daily. 180 tablet 3   potassium chloride (KLOR-CON M) 20 MEQ tablet Take 1 tablet (20 mEq total) by mouth daily. 30 tablet 2   rosuvastatin (CRESTOR) 10 MG tablet Take 1 tablet (10 mg total) by mouth daily. 90 tablet 0   No current facility-administered medications for this visit.    Allergies:   Patient has no known allergies.    Social History:  The patient  reports that he quit smoking about 4 years ago.  His smoking use included cigarettes. He has a 30.00 pack-year smoking history. He has never used smokeless tobacco. He reports that he does not currently use alcohol. He reports current drug use. Drug: Marijuana.   Family History:  The patient's family history includes Cancer in his father and mother.    ROS:  Please see the history of present illness.   Otherwise, review of systems are positive for none.   All other systems are reviewed and negative.    PHYSICAL EXAM: VS:  BP 140/80 (BP Location: Right Arm, Patient Position: Sitting, Cuff Size: Normal)   Pulse (!) 56   Ht 5\' 8"  (1.727 m)   Wt 233 lb 7 oz (105.9 kg)   SpO2 98%   BMI 35.49 kg/m   , BMI Body mass index is 35.49 kg/m. GEN: Well nourished, well developed, in no acute distress  HEENT: normal  Neck: no JVD, carotid bruits, or masses Cardiac: RRR; no murmurs, rubs, or gallops,no edema  Respiratory:  clear to auscultation bilaterally, normal work of breathing GI: soft, nontender, nondistended, + BS MS: no deformity or atrophy  Skin: warm and dry, no rash Neuro:  Strength and sensation are intact Psych: euthymic mood, full affect   EKG:  EKG is ordered today. The ekg ordered today demonstrates sinus bradycardia with PACs.   Recent Labs: 10/15/2021: B Natriuretic Peptide 70.8; Magnesium 1.8 11/22/2021: ALT 13; BUN 8; Creatinine, Ser 0.79; Hemoglobin 13.8; Platelets 285; Potassium 3.6; Sodium 142; TSH <0.005    Lipid Panel    Component Value Date/Time   CHOL 87 10/17/2021 0321   TRIG 73 10/17/2021 0321   HDL 24 (L) 10/17/2021 0321   CHOLHDL 3.6 10/17/2021 0321   VLDL 15 10/17/2021 0321   LDLCALC 48 10/17/2021 0321      Wt Readings from Last 3 Encounters:  12/22/21 233 lb 7 oz (105.9 kg)  11/22/21 230 lb (104.3 kg)  10/17/21 219 lb 4.8 oz (99.5 kg)          12/22/2021    2:05 PM  PAD Screen  Previous PAD dx? No  Previous surgical procedure? No  Pain with walking? No  Feet/toe relief with dangling? Yes  Painful, non-healing ulcers? No  Extremities discolored? No      ASSESSMENT AND PLAN:  1.  Exertional dyspnea: Concerning for possible angina equivalent considering degree of coronary artery calcifications noted on recent CT scan.  I agree with low-dose aspirin and a statin.  I recommend evaluation with a treadmill nuclear stress test to make sure he has no obstructive disease.  2.  Hyperthyroidism: He is still hyperthyroid according to most recent testing.  He has an upcoming appointment scheduled with endocrinology.    3.  Essential hypertension: Blood pressure is controlled.  I agree with metoprolol given that he is hyperthyroid.  4.  Mildly  dilated ascending aorta.  Continue blood pressure control.    Disposition:   FU with me in 4 to 6 weeks.  Signed,  12/24/2021, MD  12/22/2021 5:45 PM    [facility]

## 2021-12-27 ENCOUNTER — Ambulatory Visit: Payer: Medicare Other | Admitting: Cardiology

## 2021-12-29 ENCOUNTER — Encounter
Admission: RE | Admit: 2021-12-29 | Discharge: 2021-12-29 | Disposition: A | Discharge status: Home / Self Care | Payer: Medicare Other | Source: Ambulatory Visit | Attending: Cardiovascular Disease | Admitting: Cardiovascular Disease

## 2021-12-29 DIAGNOSIS — R072 Precordial pain: Secondary | ICD-10-CM | POA: Insufficient documentation

## 2021-12-29 LAB — NM MYOCAR MULTI W/SPECT W/WALL MOTION / EF
Angina Index: 0
Estimated workload: 6.7
Exercise duration (min): 4 min
Exercise duration (sec): 48 s
LV dias vol: 146 mL (ref 62–150)
LV sys vol: 63 mL
MPHR: 157 {beats}/min
Nuc Stress EF: 57 %
Peak HR: 173 {beats}/min
Percent HR: 110 %
Rest HR: 56 {beats}/min
Rest Nuclear Isotope Dose: 10.3 mCi
SDS: 0
SRS: 7
SSS: 1
Stress Nuclear Isotope Dose: 29.9 mCi
TID: 1

## 2021-12-29 MED ORDER — TECHNETIUM TC 99M TETROFOSMIN IV KIT
10.3400 | PACK | Freq: Once | INTRAVENOUS | Status: AC | PRN
  Administered 2021-12-29: 10.34 via INTRAVENOUS

## 2021-12-29 MED ORDER — REGADENOSON 0.4 MG/5ML IV SOLN
0.4000 mg | Freq: Once | INTRAVENOUS | Status: AC
Start: 2021-12-29 — End: 2021-12-29
  Administered 2021-12-29: 0.4 mg via INTRAVENOUS

## 2021-12-29 MED ORDER — TECHNETIUM TC 99M TETROFOSMIN IV KIT
32.0000 | PACK | Freq: Once | INTRAVENOUS | Status: AC | PRN
  Administered 2021-12-29: 29.86 via INTRAVENOUS

## 2021-12-30 ENCOUNTER — Telehealth: Payer: Self-pay

## 2021-12-30 DIAGNOSIS — I200 Unstable angina: Secondary | ICD-10-CM

## 2021-12-30 NOTE — Telephone Encounter (Signed)
Per result note below, waiting for patient to call back and let us know if he would like his cardiac cath on 6/16 or 6/19, and will at that time review instructions with him as detailed below. Lab orders have been entered accordingly.  Iran Ouch, MD  Parris-Godley, Monia Sabal, RN Cc: Gibson Ramp, RN Stress test was abnormal.  The patient was able to exercise for less than 5 minutes with severe exertional dyspnea and significant ST changes including elevation in aVR and ST depression in the inferior and anterolateral leads.  Even though imaging appeared normal, the stress test is highly suggestive of three-vessel coronary artery disease.  I called the patient and discussed the findings with him.  I recommend proceeding with left heart catheterization and possible PCI.  I discussed the procedure in details as well as risks and benefits.  I suggested June 16 or June 19 as possible date for the procedure.  He will call us back and let us know about the date.   Instructions to be sent via MyChart after reviewed with patient:  You are scheduled for a Cardiac Catheterization on ______,   with Dr. Lorine Bears.  1. Please arrive at the [facility] at _______________ (This time is one hour before your procedure to ensure your preparation). Free valet parking service is available.   Special note: Every effort is made to have your procedure done on time. Please understand that emergencies sometimes delay scheduled procedures.  2. Diet: Do not eat solid foods after midnight.  You may have clear liquids until 5 AM upon the day of the procedure.  3. Labs: You will need to have blood drawn (CBC, BMP) at the medical mall prior to your appointment date. The orders for this have already been entered.   - Please go to the [facility]. You will check in at the front desk to the right as you walk into the atrium. Valet Parking is offered if needed. - No appointment needed. You may go any day between  7 am and 6 pm.    4. Medication instructions in preparation for your procedure:   Contrast Allergy: No  On the morning of your procedure, take your Aspirin 81 MGand any morning medicines NOT listed above.  You may use sips of water.  5. Plan to go home the same day, you will only stay overnight if medically necessary. 6. You MUST have a responsible adult to drive you home. 7. An adult MUST be with you the first 24 hours after you arrive home. 8. Bring a current list of your medications, and the last time and date medication taken. 9. Bring ID and current insurance cards. 10.Please wear clothes that are easy to get on and off and wear slip-on shoes.

## 2021-12-30 NOTE — Telephone Encounter (Signed)
Spoke with patients wife and she requested that we schedule the patient on 01/10/22. I left a VM for the schedulers with patients pertinent information to schedule for first case. Sent patient instructions to his MyChart after reviewing them with his wife.  Patients wife verbalized understanding and agreed with plan.

## 2022-01-03 ENCOUNTER — Other Ambulatory Visit
Admission: RE | Admit: 2022-01-03 | Discharge: 2022-01-03 | Disposition: A | Discharge status: Home / Self Care | Payer: Medicare Other | Attending: Cardiovascular Disease | Admitting: Cardiovascular Disease

## 2022-01-03 DIAGNOSIS — I200 Unstable angina: Secondary | ICD-10-CM | POA: Diagnosis present

## 2022-01-03 LAB — BASIC METABOLIC PANEL
Anion gap: 7 (ref 5–15)
BUN: 14 mg/dL (ref 8–23)
CO2: 24 mmol/L (ref 22–32)
Calcium: 9 mg/dL (ref 8.9–10.3)
Chloride: 110 mmol/L (ref 98–111)
Creatinine, Ser: 1.16 mg/dL (ref 0.61–1.24)
GFR, Estimated: >60 mL/min (ref >60)
Glucose, Bld: 128 mg/dL — ABNORMAL HIGH (ref 70–99)
Potassium: 3.6 mmol/L (ref 3.5–5.1)
Sodium: 141 mmol/L (ref 135–145)

## 2022-01-03 LAB — CBC
HCT: 44.9 % (ref 39.0–52.0)
Hemoglobin: 14.8 g/dL (ref 13.0–17.0)
MCH: 26.1 pg (ref 26.0–34.0)
MCHC: 33 g/dL (ref 30.0–36.0)
MCV: 79.3 fL — ABNORMAL LOW (ref 80.0–100.0)
Platelets: 233 10*3/uL (ref 150–400)
RBC: 5.66 MIL/uL (ref 4.22–5.81)
RDW: 14.8 % (ref 11.5–15.5)
WBC: 8.5 10*3/uL (ref 4.0–10.5)
nRBC: 0 % (ref 0.0–0.2)

## 2022-01-10 ENCOUNTER — Encounter: Payer: Self-pay | Admitting: Cardiovascular Disease

## 2022-01-10 ENCOUNTER — Other Ambulatory Visit: Payer: Self-pay

## 2022-01-10 ENCOUNTER — Ambulatory Visit
Admission: RE | Admit: 2022-01-10 | Discharge: 2022-01-10 | Disposition: A | Discharge status: Home / Self Care | Payer: Medicare Other | Attending: Cardiovascular Disease | Admitting: Cardiovascular Disease

## 2022-01-10 ENCOUNTER — Encounter
Admission: RE | Disposition: A | Discharge status: Home / Self Care | Payer: Self-pay | Source: Home / Self Care | Attending: Cardiovascular Disease

## 2022-01-10 DIAGNOSIS — Z7982 Long term (current) use of aspirin: Secondary | ICD-10-CM | POA: Insufficient documentation

## 2022-01-10 DIAGNOSIS — Z79899 Other long term (current) drug therapy: Secondary | ICD-10-CM | POA: Diagnosis not present

## 2022-01-10 DIAGNOSIS — R9439 Abnormal result of other cardiovascular function study: Secondary | ICD-10-CM | POA: Diagnosis present

## 2022-01-10 DIAGNOSIS — I10 Essential (primary) hypertension: Secondary | ICD-10-CM | POA: Diagnosis not present

## 2022-01-10 DIAGNOSIS — I2510 Atherosclerotic heart disease of native coronary artery without angina pectoris: Secondary | ICD-10-CM | POA: Insufficient documentation

## 2022-01-10 DIAGNOSIS — Z87891 Personal history of nicotine dependence: Secondary | ICD-10-CM | POA: Diagnosis not present

## 2022-01-10 DIAGNOSIS — E669 Obesity, unspecified: Secondary | ICD-10-CM | POA: Diagnosis not present

## 2022-01-10 DIAGNOSIS — E0590 Thyrotoxicosis, unspecified without thyrotoxic crisis or storm: Secondary | ICD-10-CM | POA: Insufficient documentation

## 2022-01-10 DIAGNOSIS — R0609 Other forms of dyspnea: Secondary | ICD-10-CM | POA: Insufficient documentation

## 2022-01-10 DIAGNOSIS — H8109 Meniere's disease, unspecified ear: Secondary | ICD-10-CM | POA: Insufficient documentation

## 2022-01-10 DIAGNOSIS — R001 Bradycardia, unspecified: Secondary | ICD-10-CM | POA: Insufficient documentation

## 2022-01-10 DIAGNOSIS — R0602 Shortness of breath: Secondary | ICD-10-CM

## 2022-01-10 DIAGNOSIS — E785 Hyperlipidemia, unspecified: Secondary | ICD-10-CM | POA: Diagnosis not present

## 2022-01-10 DIAGNOSIS — Z6835 Body mass index (BMI) 35.0-35.9, adult: Secondary | ICD-10-CM | POA: Insufficient documentation

## 2022-01-10 HISTORY — DX: Meniere's disease, unspecified ear: H81.09

## 2022-01-10 HISTORY — PX: LEFT HEART CATH AND CORONARY ANGIOGRAPHY: CATH118249

## 2022-01-10 SURGERY — LEFT HEART CATH AND CORONARY ANGIOGRAPHY
Anesthesia: Moderate Sedation

## 2022-01-10 MED ORDER — VERAPAMIL HCL 2.5 MG/ML IV SOLN
INTRAVENOUS | Status: AC
  Filled 2022-01-10: qty 2

## 2022-01-10 MED ORDER — SODIUM CHLORIDE 0.9% FLUSH: 3.0000 mL | Freq: Two times a day (BID) | INTRAVENOUS | Status: DC

## 2022-01-10 MED ORDER — MIDAZOLAM HCL 2 MG/2ML IJ SOLN
INTRAMUSCULAR | Status: DC | PRN
  Administered 2022-01-10: 1 mg via INTRAVENOUS

## 2022-01-10 MED ORDER — ONDANSETRON HCL 4 MG/2ML IJ SOLN: 4.0000 mg | Freq: Four times a day (QID) | INTRAMUSCULAR | Status: DC | PRN

## 2022-01-10 MED ORDER — ASPIRIN 81 MG PO CHEW: 81.0000 mg | CHEWABLE_TABLET | ORAL | Status: DC

## 2022-01-10 MED ORDER — SODIUM CHLORIDE 0.9 % IV SOLN
INTRAVENOUS | Status: DC
Start: 2022-01-10 — End: 2022-01-10

## 2022-01-10 MED ORDER — MIDAZOLAM HCL 2 MG/2ML IJ SOLN
INTRAMUSCULAR | Status: AC
  Filled 2022-01-10: qty 2

## 2022-01-10 MED ORDER — HEPARIN SODIUM (PORCINE) 1000 UNIT/ML IJ SOLN
INTRAMUSCULAR | Status: AC
  Filled 2022-01-10: qty 10

## 2022-01-10 MED ORDER — HEPARIN (PORCINE) IN NACL 1000-0.9 UT/500ML-% IV SOLN
INTRAVENOUS | Status: DC | PRN
  Administered 2022-01-10: 1000 mL

## 2022-01-10 MED ORDER — VERAPAMIL HCL 2.5 MG/ML IV SOLN
INTRAVENOUS | Status: DC | PRN
  Administered 2022-01-10: 2.5 mg via INTRA_ARTERIAL

## 2022-01-10 MED ORDER — HEPARIN SODIUM (PORCINE) 1000 UNIT/ML IJ SOLN
INTRAMUSCULAR | Status: DC | PRN
  Administered 2022-01-10: 5000 [IU] via INTRAVENOUS

## 2022-01-10 MED ORDER — HEPARIN (PORCINE) IN NACL 1000-0.9 UT/500ML-% IV SOLN
INTRAVENOUS | Status: AC
  Filled 2022-01-10: qty 1000

## 2022-01-10 MED ORDER — SODIUM CHLORIDE 0.9 % IV SOLN: 250.0000 mL | INTRAVENOUS | Status: DC | PRN

## 2022-01-10 MED ORDER — IOHEXOL 300 MG/ML  SOLN
INTRAMUSCULAR | Status: DC | PRN
  Administered 2022-01-10: 42 mL

## 2022-01-10 MED ORDER — FENTANYL CITRATE (PF) 100 MCG/2ML IJ SOLN
INTRAMUSCULAR | Status: DC | PRN
  Administered 2022-01-10: 25 ug via INTRAVENOUS

## 2022-01-10 MED ORDER — FENTANYL CITRATE (PF) 100 MCG/2ML IJ SOLN
INTRAMUSCULAR | Status: AC
  Filled 2022-01-10: qty 2

## 2022-01-10 MED ORDER — LIDOCAINE HCL (PF) 1 % IJ SOLN
INTRAMUSCULAR | Status: DC | PRN
  Administered 2022-01-10: 2 mL

## 2022-01-10 MED ORDER — SODIUM CHLORIDE 0.9% FLUSH
3.0000 mL | INTRAVENOUS | Status: DC | PRN
Start: 2022-01-10 — End: 2022-01-10

## 2022-01-10 MED ORDER — SODIUM CHLORIDE 0.9 % IV SOLN: INTRAVENOUS | Status: DC

## 2022-01-10 MED ORDER — METOPROLOL TARTRATE 50 MG PO TABS: 25.0000 mg | ORAL_TABLET | Freq: Two times a day (BID) | ORAL | 3 refills | Status: DC

## 2022-01-10 MED ORDER — ACETAMINOPHEN 325 MG PO TABS: 650.0000 mg | ORAL_TABLET | ORAL | Status: DC | PRN

## 2022-01-10 MED ORDER — SODIUM CHLORIDE 0.9% FLUSH: 3.0000 mL | INTRAVENOUS | Status: DC | PRN

## 2022-01-10 MED ORDER — LIDOCAINE HCL 1 % IJ SOLN
INTRAMUSCULAR | Status: AC
  Filled 2022-01-10: qty 20

## 2022-01-10 SURGICAL SUPPLY — 10 items
BAND ZEPHYR COMPRESS 30 LONG (HEMOSTASIS) ×1 IMPLANT
CATH INFINITI 5FR JK (CATHETERS) ×1 IMPLANT
DRAPE BRACHIAL (DRAPES) ×1 IMPLANT
GLIDESHEATH SLEND SS 6F .021 (SHEATH) ×1 IMPLANT
GUIDEWIRE INQWIRE 1.5J.035X260 (WIRE) IMPLANT
INQWIRE 1.5J .035X260CM (WIRE) ×2
PACK CARDIAC CATH (CUSTOM PROCEDURE TRAY) ×2 IMPLANT
PROTECTION STATION PRESSURIZED (MISCELLANEOUS) ×2
SET ATX SIMPLICITY (MISCELLANEOUS) ×1 IMPLANT
STATION PROTECTION PRESSURIZED (MISCELLANEOUS) IMPLANT

## 2022-01-10 NOTE — Interval H&P Note (Signed)
Cath Lab Visit (complete for each Cath Lab visit)  Clinical Evaluation Leading to the Procedure:   ACS: No.  Non-ACS:    Anginal Classification: CCS III  Anti-ischemic medical therapy: Minimal Therapy (1 class of medications)  Non-Invasive Test Results: Intermediate-risk stress test findings: cardiac mortality 1-3%/year  Prior CABG: No previous CABG      History and Physical Interval Note:  01/10/2022 9:30 AM  Albino Neomia Dear  has presented today for surgery, with the diagnosis of LT Heart Cath   Abnormal cardiovascular stress test.  The various methods of treatment have been discussed with the patient and family. After consideration of risks, benefits and other options for treatment, the patient has consented to  Procedure(s): LEFT HEART CATH AND CORONARY ANGIOGRAPHY (N/A) as a surgical intervention.  The patient's history has been reviewed, patient examined, no change in status, stable for surgery.  I have reviewed the patient's chart and labs.  Questions were answered to the patient's satisfaction.     Lorine Bears

## 2022-01-14 ENCOUNTER — Ambulatory Visit (INDEPENDENT_AMBULATORY_CARE_PROVIDER_SITE_OTHER): Payer: Medicare Other | Admitting: Cardiovascular Disease

## 2022-01-14 ENCOUNTER — Encounter: Payer: Self-pay | Admitting: Cardiovascular Disease

## 2022-01-14 VITALS — BP 140/70 | HR 57 | Ht 68.0 in | Wt 232.5 lb

## 2022-01-14 DIAGNOSIS — I2510 Atherosclerotic heart disease of native coronary artery without angina pectoris: Secondary | ICD-10-CM

## 2022-01-14 DIAGNOSIS — E785 Hyperlipidemia, unspecified: Secondary | ICD-10-CM

## 2022-01-14 DIAGNOSIS — I200 Unstable angina: Secondary | ICD-10-CM | POA: Diagnosis not present

## 2022-01-14 DIAGNOSIS — I10 Essential (primary) hypertension: Secondary | ICD-10-CM

## 2022-01-14 DIAGNOSIS — E0590 Thyrotoxicosis, unspecified without thyrotoxic crisis or storm: Secondary | ICD-10-CM

## 2022-01-20 ENCOUNTER — Encounter: Payer: Self-pay | Admitting: Nurse Practitioner

## 2022-01-20 ENCOUNTER — Ambulatory Visit (INDEPENDENT_AMBULATORY_CARE_PROVIDER_SITE_OTHER): Payer: Medicare Other | Admitting: Nurse Practitioner

## 2022-01-20 VITALS — BP 145/87 | HR 52 | Ht 68.0 in | Wt 232.0 lb

## 2022-01-20 DIAGNOSIS — E0590 Thyrotoxicosis, unspecified without thyrotoxic crisis or storm: Secondary | ICD-10-CM

## 2022-01-20 NOTE — Progress Notes (Signed)
01/20/2022     Endocrinology Consult Note    Subjective:    Patient ID: James Andersen, male    DOB: 04/09/1961, PCP Lorrene Reid, PA-C.   Past Medical History:  Diagnosis Date   High cholesterol    Hypertension    Meniere's disease    Thyroid disease     Past Surgical History:  Procedure Laterality Date   HERNIA REPAIR     LEFT HEART CATH AND CORONARY ANGIOGRAPHY N/A 01/10/2022   Procedure: LEFT HEART CATH AND CORONARY ANGIOGRAPHY;  Surgeon: Wellington Hampshire, MD;  Location: [facility];  Service: Cardiovascular;  Laterality: N/A;   VASECTOMY      Social History   Socioeconomic History   Marital status: Married    Spouse name: Not on file   Number of children: Not on file   Years of education: Not on file   Highest education level: Not on file  Occupational History   Not on file  Tobacco Use   Smoking status: Former    Packs/day: 1.00    Years: 30.00    Total pack years: 30.00    Types: Cigarettes    Quit date: 02/22/2017    Years since quitting: 4.9   Smokeless tobacco: Never  Vaping Use   Vaping Use: Never used  Substance and Sexual Activity   Alcohol use: Not Currently   Drug use: Yes    Types: Marijuana   Sexual activity: Not on file  Other Topics Concern   Not on file  Social History Narrative   Not on file   Social Determinants of Health   Financial Resource Strain: Not on file  Food Insecurity: Not on file  Transportation Needs: Not on file  Physical Activity: Not on file  Stress: Not on file  Social Connections: Not on file    Family History  Problem Relation Age of Onset   Cancer Mother    Cancer Father     Outpatient Encounter Medications as of 01/20/2022  Medication Sig   albuterol (VENTOLIN HFA) 108 (90 Base) MCG/ACT inhaler Inhale 1 puff into the lungs every 6 (six) hours as needed for wheezing or shortness of breath.   ALPRAZolam (XANAX) 1 MG tablet Take 1 mg by mouth at bedtime.   amLODipine (NORVASC)  5 MG tablet Take 5 mg by mouth in the morning and at bedtime.   aspirin EC 81 MG tablet Take 81 mg by mouth daily. Swallow whole.   lisinopril (ZESTRIL) 40 MG tablet Take 40 mg by mouth daily.   methimazole (TAPAZOLE) 5 MG tablet Take 1 tablet (5 mg total) by mouth daily. (Patient taking differently: Take 10 mg by mouth 2 (two) times daily.)   metoprolol tartrate (LOPRESSOR) 50 MG tablet Take 0.5 tablets (25 mg total) by mouth 2 (two) times daily.   potassium chloride (KLOR-CON M) 20 MEQ tablet Take 1 tablet (20 mEq total) by mouth daily.   rosuvastatin (CRESTOR) 10 MG tablet Take 1 tablet (10 mg total) by mouth daily.   No facility-administered encounter medications on file as of 01/20/2022.    ALLERGIES: No Known Allergies  VACCINATION STATUS:  There is no immunization history on file for this patient.   HPI  James Andersen is 61 y.o. male who presents today with a medical history as above. he is being seen in consultation for hyperthyroidism requested by Lorrene Reid, PA-C.  he has been dealing with symptoms of tremors, unintentional weight loss, diarrhea,  insomnia, anxiety, and SOB for 4  months. These symptoms are progressively worsening and troubling to him.  his most recent thyroid labs revealed suppressed TSH of < 0.005 and elevated FT4 of 2.16 on 11/22/21 where he was started on Methimazole and now titrated up to 10 mg po BID.  He had a thyroid ultrasound performed in March 2023 which was normal.  he denies dysphagia, choking, shortness of breath, no recent voice change.    he denies known family history of thyroid dysfunction and denies family hx of thyroid cancer. he denies personal history of goiter. Denies use of Biotin containing supplements.  he is willing to proceed with appropriate work up and therapy for thyrotoxicosis.   Review of systems  Constitutional: + steadily decreasing body weight, current Body mass index is 35.28 kg/m., no fatigue, no subjective  hyperthermia, no subjective hypothermia Eyes: no blurry vision, no xerophthalmia ENT: no sore throat, no nodules palpated in throat, no dysphagia/odynophagia, no hoarseness Cardiovascular: no chest pain, + shortness of breath, + palpitations, no leg swelling Respiratory: no cough, + shortness of breath Gastrointestinal: no nausea/vomiting, + diarrhea Musculoskeletal: no muscle/joint aches Skin: no rashes, no hyperemia Neurological: + tremors, no numbness, no tingling, no dizziness Psychiatric: no depression, + anxiety   Objective:    BP (!) 145/87   Pulse (!) 52   Ht 5\' 8"  (1.727 m)   Wt 232 lb (105.2 kg)   BMI 35.28 kg/m   Wt Readings from Last 3 Encounters:  01/20/22 232 lb (105.2 kg)  01/14/22 232 lb 8 oz (105.5 kg)  01/10/22 230 lb (104.3 kg)     BP Readings from Last 3 Encounters:  01/20/22 (!) 145/87  01/14/22 140/70  01/10/22 (!) 142/91                        Physical Exam- Limited  Constitutional:  Body mass index is 35.28 kg/m. , not in acute distress, normal state of mind Eyes:  EOMI, no exophthalmos Neck: Supple Thyroid: No gross goiter Cardiovascular: RRR, no murmurs, rubs, or gallops, no edema Respiratory: Adequate breathing efforts, no crackles, rales, rhonchi, or wheezing Musculoskeletal: no gross deformities, strength intact in all four extremities, no gross restriction of joint movements Skin:  no rashes, no hyperemia Neurological: ++ tremor with outstretched hands   CMP     Component Value Date/Time   NA 141 01/03/2022 0936   NA 142 11/22/2021 1506   K 3.6 01/03/2022 0936   CL 110 01/03/2022 0936   CO2 24 01/03/2022 0936   GLUCOSE 128 (H) 01/03/2022 0936   BUN 14 01/03/2022 0936   BUN 8 11/22/2021 1506   CREATININE 1.16 01/03/2022 0936   CALCIUM 9.0 01/03/2022 0936   PROT 7.0 11/22/2021 1506   ALBUMIN 4.3 11/22/2021 1506   AST 12 11/22/2021 1506   ALT 13 11/22/2021 1506   ALKPHOS 93 11/22/2021 1506   BILITOT 0.6 11/22/2021 1506    GFRNONAA >60 01/03/2022 0936     CBC    Component Value Date/Time   WBC 8.5 01/03/2022 0936   RBC 5.66 01/03/2022 0936   HGB 14.8 01/03/2022 0936   HGB 13.8 11/22/2021 1506   HCT 44.9 01/03/2022 0936   HCT 42.1 11/22/2021 1506   PLT 233 01/03/2022 0936   PLT 285 11/22/2021 1506   MCV 79.3 (L) 01/03/2022 0936   MCV 80 11/22/2021 1506   MCH 26.1 01/03/2022 0936   MCHC 33.0 01/03/2022 0936   RDW  14.8 01/03/2022 0936   RDW 14.3 11/22/2021 1506   LYMPHSABS 2.0 11/22/2021 1506   EOSABS 0.3 11/22/2021 1506   BASOSABS 0.1 11/22/2021 1506     Diabetic Labs (most recent): Lab Results  Component Value Date   HGBA1C 5.7 (H) 10/17/2021    Lipid Panel     Component Value Date/Time   CHOL 87 10/17/2021 0321   TRIG 73 10/17/2021 0321   HDL 24 (L) 10/17/2021 0321   CHOLHDL 3.6 10/17/2021 0321   VLDL 15 10/17/2021 0321   LDLCALC 48 10/17/2021 0321     Lab Results  Component Value Date   TSH <0.005 (L) 11/22/2021   TSH <0.010 (L) 10/16/2021   FREET4 2.16 (H) 11/22/2021   FREET4 4.79 (H) 10/16/2021        Assessment & Plan:   1. Hyperthyroidism  he is being seen at a kind request of Abonza, Maritza, PA-C.  his history and most recent labs are reviewed, and he was examined clinically. Subjective and objective findings are consistent with thyrotoxicosis likely from primary hyperthyroidism. The potential risks of untreated thyrotoxicosis and the need for definitive therapy have been discussed in detail with him, and he agrees to proceed with diagnostic workup and treatment plan.   I will repeat full profile thyroid function tests today, including antibody testing to assess for autoimmune thyroid dysfunction.  If labs are stable, we can begin to taper him off Methimazole.   Confirmatory thyroid uptake and scan will be scheduled to be done as soon as possible but he must be off Methimazole for at least 5 days prior to the test.   Options of therapy are discussed with him.  We  discussed the option of treating it with medications including methimazole or PTU which may have side effects including rash, transaminitis, and bone marrow suppression.  We also discussed the option of definitive therapy with RAI ablation of the thyroid. If he is found to have primary hyperthyroidism from Graves' disease , toxic multinodular goiter or toxic nodular goiter the preferred modality of treatment would be I-131 thyroid ablation. Surgery is another choice of treatment in some cases, in his case surgery is not a good fit for presentation with only mild goiter.  -Patient is made aware of the high likelihood of post ablative hypothyroidism with subsequent need for lifelong thyroid hormone replacement. heunderstands this outcome and he is  willing to proceed.      he will return in 6 weeks for treatment decision.  I will call him with further instructions for his Methimazole once his labs have resulted, then we can plan on how to proceed from there.    -Patient is advised to maintain close follow up with Mayer Masker, PA-C for primary care needs.   - Time spent with the patient: 60 minutes, of which >50% was spent in obtaining information about his symptoms, reviewing his previous labs, evaluations, and treatments, counseling him about his hyperthyroidism , and developing a plan to confirm the diagnosis and long term treatment as necessary. Please refer to "Patient Self Inventory" in the Media tab for reviewed elements of pertinent patient history.  Peter Baldo Ash [place]nek participated in the discussions, expressed understanding, and voiced agreement with the above plans.  All questions were answered to his satisfaction. he is encouraged to contact clinic should he have any questions or concerns prior to his return visit.   Follow up plan: Return in about 6 weeks (around 03/03/2022) for Thyroid follow up, labs today then will  call with results.   Thank you for involving me in the care of  this pleasant patient, and I will continue to update you with his progress.    Rayetta Pigg, [facility] [address] [place], [place] [address] Phone: [phone] Fax: [phone]  01/20/2022, 1:42 PM

## 2022-01-20 NOTE — Patient Instructions (Signed)
Methimazole Tablets What is this medication? METHIMAZOLE (meth IM a zole) treats high thyroid levels (hyperthyroidism) in your body. It works by decreasing the amount of thyroid hormone your body makes. This medicine may be used for other purposes; ask your health care provider or pharmacist if you have questions. COMMON BRAND NAME(S): Northyx, Tapazole What should I tell my care team before I take this medication? They need to know if you have any of these conditions: Liver disease Low blood counts, like low white cell counts An unusual or allergic reaction to methimazole, other medications, foods, dyes, or preservatives Pregnant or trying to get pregnant Breast-feeding How should I use this medication? Take this medication by mouth with a glass of water. Follow the directions on the prescription label. You can take this medication with or without food. However, you should always take it the same way to make sure the effects are the same. Take your doses at regular intervals. Do not take your medication more often than directed. Do not stop taking this medication except on the advice of your care team. Talk to your care team about the use of this medication in children. Special care may be needed. While this medication may be prescribed for children for selected conditions, precautions do apply. Overdosage: If you think you have taken too much of this medicine contact a poison control center or emergency room at once. NOTE: This medicine is only for you. Do not share this medicine with others. What if I miss a dose? If you miss a dose, take it as soon as you can. If it is almost time for your next dose, take only that dose. Do not take double or extra doses. What may interact with this medication? Aminophylline Certain medications for high blood pressure, heart disease, irregular heartbeat like metoprolol and propranolol Digoxin Theophylline Warfarin This list may not describe all possible  interactions. Give your health care provider a list of all the medicines, herbs, non-prescription drugs, or dietary supplements you use. Also tell them if you smoke, drink alcohol, or use illegal drugs. Some items may interact with your medicine. What should I watch for while using this medication? Visit your care team for regular checks on your progress. Tell your care team if your symptoms do not start to get better or if they get worse. It may take time for your condition to improve. You will need tests to check your blood counts and to make sure your body is making the right amount of thyroid hormone. Call your care team for advice if you get a fever, chills or sore throat. Do not treat yourself. Women should inform their care team if they wish to become pregnant or think they might be pregnant. There is a potential for serious side effects to an unborn child. Talk to your care team for more information. If you are going to need surgery or other procedure, tell your care team that you are using this medication. What side effects may I notice from receiving this medication? Side effects that you should report to your care team as soon as possible: Allergic reactions--skin rash, itching, hives, swelling of the face, lips, tongue, or throat Infection--fever, chills, cough, or sore throat Liver injury--right upper belly pain, loss of appetite, nausea, light-colored stool, dark yellow or brown urine, yellowing skin or eyes, unusual weakness or fatigue Low thyroid levels (hypothyroidism)--unusual weakness or fatigue, increased sensitivity to cold, constipation, hair loss, dry skin, weight gain, feelings of depression Unusual weakness or  fatigue, fever, headache, skin rash, muscle or joint pain, loss of appetite, pain, tingling, or numbness in the hands or feet Side effects that usually do not require medical attention (report to your care team if they continue or are bothersome): Change in  taste Dizziness Hair loss Headache Nausea Upset stomach This list may not describe all possible side effects. Call your doctor for medical advice about side effects. You may report side effects to FDA at 1-800-FDA-1088. Where should I keep my medication? Keep out of the reach of children. Store at room temperature between 20 and 25 degrees C (68 and 77 degrees F). Keep tightly closed. Protect from light. Throw away any unused medication after the expiration date. NOTE: This sheet is a summary. It may not cover all possible information. If you have questions about this medicine, talk to your doctor, pharmacist, or health care provider.  2023 Elsevier/Gold Standard (2020-12-04 00:00:00)  

## 2022-01-21 NOTE — Addendum Note (Signed)
Addended by: Dani Gobble on: 01/21/2022 07:45 AM   Modules accepted: Orders

## 2022-01-22 LAB — T3, FREE: T3, Free: 3.1 pg/mL (ref 2.0–4.4)

## 2022-01-22 LAB — THYROID PEROXIDASE ANTIBODY: Thyroperoxidase Ab SerPl-aCnc: 109 IU/mL — ABNORMAL HIGH (ref 0–34)

## 2022-01-22 LAB — THYROGLOBULIN ANTIBODY: Thyroglobulin Antibody: 1.8 IU/mL — ABNORMAL HIGH (ref 0.0–0.9)

## 2022-01-22 LAB — TSH: TSH: 1.41 u[IU]/mL (ref 0.450–4.500)

## 2022-01-22 LAB — T4, FREE: Free T4: 0.72 ng/dL — ABNORMAL LOW (ref 0.82–1.77)

## 2022-01-27 NOTE — Patient Instructions (Signed)

## 2022-02-02 ENCOUNTER — Encounter: Payer: Self-pay | Admitting: Physician Assistant

## 2022-02-02 ENCOUNTER — Ambulatory Visit (INDEPENDENT_AMBULATORY_CARE_PROVIDER_SITE_OTHER): Payer: Medicare Other | Admitting: Physician Assistant

## 2022-02-02 VITALS — BP 134/81 | HR 89 | Temp 97.7°F | Ht 68.0 in | Wt 233.0 lb

## 2022-02-02 DIAGNOSIS — Z0000 Encounter for general adult medical examination without abnormal findings: Secondary | ICD-10-CM | POA: Diagnosis not present

## 2022-02-02 NOTE — Progress Notes (Signed)
Subjective:   James Andersen is a 61 y.o. male who presents for Medicare Annual/Subsequent preventive examination.  Review of Systems    Refer to PCP  I connected with  Tonia Brooms on 02/02/22 by an IN PERSON application and verified that I am speaking with the correct person using two identifiers.   I discussed the limitations, risks, security and privacy concerns of performing an evaluation and management service by telephone and the availability of in person appointments. I also discussed with the patient that there may be a patient responsible charge related to this service. The patient expressed understanding and verbally consented to this telephonic visit.  Location of Patient: Office Location of Provider:Office  List any persons and their role that are participating in the visit with the patient.    Emanuelle Hammerstrom, CMA    Objective:    Today's Vitals   02/02/22 0952  BP: 134/81  Pulse: 89  Temp: 97.7 F (36.5 C)  SpO2: 100%  Weight: 233 lb (105.7 kg)  Height: 5\' 8"  (1.727 m)   Body mass index is 35.43 kg/m.     01/10/2022    8:46 AM 10/16/2021    1:00 AM  Advanced Directives  Does Patient Have a Medical Advance Directive? No No  Would patient like information on creating a medical advance directive? No - Patient declined No - Patient declined    Current Medications (verified) Outpatient Encounter Medications as of 02/02/2022  Medication Sig   albuterol (VENTOLIN HFA) 108 (90 Base) MCG/ACT inhaler Inhale 1 puff into the lungs every 6 (six) hours as needed for wheezing or shortness of breath.   ALPRAZolam (XANAX) 1 MG tablet Take 1 mg by mouth at bedtime.   amLODipine (NORVASC) 5 MG tablet Take 5 mg by mouth in the morning and at bedtime.   aspirin EC 81 MG tablet Take 81 mg by mouth daily. Swallow whole.   lisinopril (ZESTRIL) 40 MG tablet Take 40 mg by mouth daily.   metoprolol tartrate (LOPRESSOR) 50 MG tablet Take 0.5 tablets (25 mg total)  by mouth 2 (two) times daily.   potassium chloride (KLOR-CON M) 20 MEQ tablet Take 1 tablet (20 mEq total) by mouth daily.   rosuvastatin (CRESTOR) 10 MG tablet Take 1 tablet (10 mg total) by mouth daily.   [DISCONTINUED] methimazole (TAPAZOLE) 5 MG tablet Take 1 tablet (5 mg total) by mouth daily. (Patient taking differently: Take 10 mg by mouth 2 (two) times daily.)   No facility-administered encounter medications on file as of 02/02/2022.    Allergies (verified) Patient has no known allergies.   History: Past Medical History:  Diagnosis Date   High cholesterol    Hypertension    Meniere's disease    Thyroid disease    Past Surgical History:  Procedure Laterality Date   HERNIA REPAIR     LEFT HEART CATH AND CORONARY ANGIOGRAPHY N/A 01/10/2022   Procedure: LEFT HEART CATH AND CORONARY ANGIOGRAPHY;  Surgeon: 01/12/2022, MD;  Location: [facility] INVASIVE CV [facility];  Service: Cardiovascular;  Laterality: N/A;   VASECTOMY     Family History  Problem Relation Age of Onset   Cancer Mother    Cancer Father    Social History   Socioeconomic History   Marital status: Married    Spouse name: James Andersen   Number of children: 1   Years of education: Not on file   Highest education level: Not on file  Occupational History   Not on  file  Tobacco Use   Smoking status: Former    Packs/day: 1.00    Years: 30.00    Total pack years: 30.00    Types: Cigarettes    Quit date: 02/22/2017    Years since quitting: 4.9   Smokeless tobacco: Never  Vaping Use   Vaping Use: Never used  Substance and Sexual Activity   Alcohol use: Not Currently   Drug use: Yes    Types: Marijuana   Sexual activity: Not Currently  Other Topics Concern   Not on file  Social History Narrative   Not on file   Social Determinants of Health   Financial Resource Strain: Medium Risk (02/02/2022)   Overall Financial Resource Strain (CARDIA)    Difficulty of Paying Living Expenses: Somewhat hard  Food Insecurity:  No Food Insecurity (02/02/2022)   Hunger Vital Sign    Worried About Running Out of Food in the Last Year: Never true    Ran Out of Food in the Last Year: Never true  Transportation Needs: No Transportation Needs (02/02/2022)   PRAPARE - Administrator, Civil Service (Medical): No    Lack of Transportation (Non-Medical): No  Physical Activity: Sufficiently Active (02/02/2022)   Exercise Vital Sign    Days of Exercise per Week: 7 days    Minutes of Exercise per Session: 60 min  Stress: Stress Concern Present (02/02/2022)   [employer] of Occupational Health - Occupational Stress Questionnaire    Feeling of Stress : To some extent  Social Connections: Moderately Isolated (02/02/2022)   Social Connection and Isolation Panel [NHANES]    Frequency of Communication with Friends and Family: Three times a week    Frequency of Social Gatherings with Friends and Family: More than three times a week    Attends Religious Services: Never    Database administrator or Organizations: No    Attends Banker Meetings: Never    Marital Status: Married    Tobacco Counseling Counseling given: Not Answered   Clinical Intake:                 Diabetic?No         Activities of Daily Living    02/02/2022    9:53 AM 01/10/2022    8:42 AM  In your present state of health, do you have any difficulty performing the following activities:  Hearing? 1 1  Vision? 1 0  Difficulty concentrating or making decisions? 0 0  Walking or climbing stairs? 1 0  Dressing or bathing? 0 0  Doing errands, shopping? 0     Patient Care Team: Mayer Masker, PA-C as PCP - General (Physician Assistant) James Ouch, MD as PCP - Cardiology (Cardiology)  Indicate any recent Medical Services you may have received from other than [facility] providers in the past year (date may be approximate).     Assessment:   This is a routine wellness examination for James Andersen.  Hearing/Vision  screen No results found.  Dietary issues and exercise activities discussed:     Goals Addressed   None   Depression Screen    02/02/2022    9:53 AM 11/22/2021    2:28 PM  PHQ 2/9 Scores  PHQ - 2 Score 0 0  PHQ- 9 Score 0     Fall Risk    02/02/2022    9:53 AM 11/22/2021    2:28 PM  Fall Risk   Falls in the past year? 0 0  Number falls in past yr: 0 0  Injury with Fall? 0 0  Risk for fall due to : No Fall Risks History of fall(s)  Follow up Falls evaluation completed Falls evaluation completed    FALL RISK PREVENTION PERTAINING TO THE HOME:  Any stairs in or around the home? Yes  If so, are there any without handrails? Yes  Home free of loose throw rugs in walkways, pet beds, electrical cords, etc? Yes  Adequate lighting in your home to reduce risk of falls? Yes   ASSISTIVE DEVICES UTILIZED TO PREVENT FALLS:  Life alert? No  Use of a cane, walker or w/c? No  Grab bars in the bathroom? Yes  Shower chair or bench in shower? No  Elevated toilet seat or a handicapped toilet? Yes  TIMED UP AND GO:  Was the test performed? Yes .  Length of time to ambulate 10 feet: 8 sec.   Gait steady and fast without use of assistive device  Cognitive Function:        02/02/2022   10:04 AM  6CIT Screen  What Year? 0 points  What month? 0 points  What time? 0 points  Count back from 20 0 points  Months in reverse 0 points  Repeat phrase 0 points  Total Score 0 points    Immunizations  There is no immunization history on file for this patient.  TDAP status: Due, Education has been provided regarding the importance of this vaccine. Advised may receive this vaccine at local pharmacy or Health Dept. Aware to provide a copy of the vaccination record if obtained from local pharmacy or Health Dept. Verbalized acceptance and understanding.  Flu Vaccine status: Declined, Education has been provided regarding the importance of this vaccine but patient still declined. Advised may  receive this vaccine at local pharmacy or Health Dept. Aware to provide a copy of the vaccination record if obtained from local pharmacy or Health Dept. Verbalized acceptance and understanding.  Pneumococcal vaccine status: Declined,  Education has been provided regarding the importance of this vaccine but patient still declined. Advised may receive this vaccine at local pharmacy or Health Dept. Aware to provide a copy of the vaccination record if obtained from local pharmacy or Health Dept. Verbalized acceptance and understanding.   Covid-19 vaccine status: Completed vaccines  Qualifies for Shingles Vaccine? Yes   Zostavax completed No   Shingrix Completed?: No.    Education has been provided regarding the importance of this vaccine. Patient has been advised to call insurance company to determine out of pocket expense if they have not yet received this vaccine. Advised may also receive vaccine at local pharmacy or Health Dept. Verbalized acceptance and understanding.  Screening Tests Health Maintenance  Topic Date Due   COVID-19 Vaccine (1) Never done   Hepatitis C Screening  Never done   TETANUS/TDAP  Never done   Zoster Vaccines- Shingrix (1 of 2) Never done   COLONOSCOPY (Pts 45-20yrs Insurance coverage will need to be confirmed)  Never done   INFLUENZA VACCINE  02/22/2022   HIV Screening  Completed   HPV VACCINES  Aged Out    Health Maintenance  Health Maintenance Due  Topic Date Due   COVID-19 Vaccine (1) Never done   Hepatitis C Screening  Never done   TETANUS/TDAP  Never done   Zoster Vaccines- Shingrix (1 of 2) Never done   COLONOSCOPY (Pts 45-28yrs Insurance coverage will need to be confirmed)  Never done    Colorectal cancer  screening: Type of screening: Cologuard. Completed 2022. Repeat every 3 years  Lung Cancer Screening: (Low Dose CT Chest recommended if Age 56-80 years, 30 pack-year currently smoking OR have quit w/in 15years.) does not qualify.   Lung Cancer  Screening Referral:   Additional Screening:  Hepatitis C Screening: Patient declined  Vision Screening: Recommended annual ophthalmology exams for early detection of glaucoma and other disorders of the eye. Is the patient up to date with their annual eye exam?  Yes  Who is the provider or what is the name of the office in which the patient attends annual eye exams? [facility] If pt is not established with a provider, would they like to be referred to a provider to establish care? No .   Dental Screening: Recommended annual dental exams for proper oral hygiene  Community Resource Referral / Chronic Care Management: CRR required this visit?  No   CCM required this visit?  No      Plan:     I have personally reviewed and noted the following in the patient's chart:   Medical and social history Use of alcohol, tobacco or illicit drugs  Current medications and supplements including opioid prescriptions. Patient is not currently taking opioid prescriptions. Functional ability and status Nutritional status Physical activity Advanced directives List of other physicians Hospitalizations, surgeries, and ER visits in previous 12 months Vitals Screenings to include cognitive, depression, and falls Referrals and appointments  In addition, I have reviewed and discussed with patient certain preventive protocols, quality metrics, and best practice recommendations. A written personalized care plan for preventive services as well as general preventive health recommendations were provided to patient.     Laren Everts Daylene Vandenbosch, CMA   02/02/2022   Nurse Notes: Face to Face 20 minutes   Mr. Tengan , Thank you for taking time to come for your Medicare Wellness Visit. I appreciate your ongoing commitment to your health goals. Please review the following plan we discussed and let me know if I can assist you in the future.   These are the goals we discussed:  Goals   None     Tdap Shingrix Pneumonia  This is a list of the screening recommended for you and due dates:  Health Maintenance  Topic Date Due   COVID-19 Vaccine (1) Never done   Hepatitis C Screening: USPSTF Recommendation to screen - Ages 39-79 yo.  Never done   Tetanus Vaccine  Never done   Zoster (Shingles) Vaccine (1 of 2) Never done   Colon Cancer Screening  Never done   Flu Shot  02/22/2022   HIV Screening  Completed   HPV Vaccine  Aged Out

## 2022-02-21 ENCOUNTER — Encounter (HOSPITAL_COMMUNITY): Payer: Self-pay | Admitting: Psychiatry

## 2022-02-21 ENCOUNTER — Ambulatory Visit (HOSPITAL_BASED_OUTPATIENT_CLINIC_OR_DEPARTMENT_OTHER): Payer: Medicare Other | Admitting: Psychiatry

## 2022-02-21 ENCOUNTER — Other Ambulatory Visit (HOSPITAL_COMMUNITY): Payer: Self-pay | Admitting: *Deleted

## 2022-02-21 ENCOUNTER — Telehealth: Payer: Self-pay | Admitting: Nurse Practitioner

## 2022-02-21 VITALS — Wt 233.0 lb

## 2022-02-21 DIAGNOSIS — F419 Anxiety disorder, unspecified: Secondary | ICD-10-CM

## 2022-02-21 DIAGNOSIS — E0590 Thyrotoxicosis, unspecified without thyrotoxic crisis or storm: Secondary | ICD-10-CM

## 2022-02-21 DIAGNOSIS — F5101 Primary insomnia: Secondary | ICD-10-CM | POA: Diagnosis not present

## 2022-02-21 MED ORDER — CLONAZEPAM 0.5 MG PO TABS: ORAL_TABLET | ORAL | 0 refills | Status: DC

## 2022-02-21 MED ORDER — GABAPENTIN 100 MG PO CAPS: 100.0000 mg | ORAL_CAPSULE | Freq: Every day | ORAL | 1 refills | Status: DC

## 2022-02-21 NOTE — Telephone Encounter (Signed)
I entered the labs

## 2022-02-21 NOTE — Progress Notes (Signed)
[facility] Initial Assessment Note  Patient Location: Home Provider Location: Home Office   I connected with James Andersen by video and verified that I am talking with correct person using two identifiers.   I discussed the limitations, risks, security and privacy concerns of performing an evaluation and management service virtually and the availability of in person appointments. I also discussed with the patient that there may be a patient responsible charge related to this service. The patient expressed understanding and agreed to proceed.  James Andersen UK:[id number] 61 y.o.  02/21/2022 11:03 AM  Chief Complaint:  I was told by my PCP to see psychiatrist for Xanax which I am taking for meniere's disease.   History of Present Illness:  Patient is 61 year old Caucasian, retired on disability, married man who is referred from his PCP.  Patient told his PCP referred to see a psychiatrist because he is taking Xanax for his Mnire's disease.  Patient never seen psychiatrist and denies any history of depression, psychosis, mania, hallucination, paranoia or suicidal thoughts.  He reported diagnosed with Mnire's disease 7 years ago and he recalls since then he has episodes of severe nausea, ringing in his ear, severe anxiety and cannot sleep.  His primary care physician in [place] prescribed Xanax which he is taking for the past 5 years.  Patient told he had tried so many medication for many years disease however none of the medicine helped these episodes.  He recalled these episodes are stress related.  He reported half of his face is affected which she described short circuit.  He reported ringing in his right ear which is 24/7 and sometime severe enough that he cannot sleep.  He recalled his physician tried Valium but it was too strong and he never took it again.  Patient moved from [facility] last November and they were staying in a hotel until recently they  find a place in [place].  Patient told [place] was expensive and they want to live close to their only daughter who lives in [place] so they can see the 2 grandkids.  Patient denies any crying spells, feeling of hopelessness, anhedonia, worthlessness or any panic attack.  Denies any agitation, mania, impulsive behavior, road rage or any issues with the law.  He admitted smoked marijuana 2-3 times because that also calms him down when he has episodes of ringing in his ear.  Patient told that he has medical card of marijuana issued in [place].  He tried to get his Xanax refill from his primary care physician however he was told to see a psychiatrist.  Patient also had hyperthyroidism and reported having tremors, palpitation, weight loss and now he is scheduled to see endocrinologist in few days.  Patient denies drinking or using any illegal substances.  He was admitted in the hospital in March because of cardiac issues.  He is taking multiple medication for high blood pressure.  He denies any chronic pain, neuropathy, PTSD symptoms.  He has a history of migraine headaches however that has subsided and occasionally he has headaches.  He is taking Xanax 1 mg but admitted some nights he only takes 0.5 because without this he cannot sleep.  Patient lives with his wife.  He is on disability since 2017.   Past Psychiatric History: Denies any history of seeing a psychiatrist in the past.  Denies any history of depression, mania, psychosis, hallucination or any inpatient treatment.  Diagnosed with Mnire's disease 7 years ago and  taking Xanax 5 years ago from his primary care doctor.  Family History  Problem Relation Age of Onset   Cancer Mother    Cancer Father       Past Medical History:  Diagnosis Date   High cholesterol    Hypertension    Meniere's disease    Thyroid disease      Traumatic Head Injury: Denies any history of head trauma.  Work History; Patient currently on  disability.  He used to work as an Art gallery manager.  Psychosocial History; Patient born in [place] but moved to [place] 30 years ago when his parents health deteriorated who are living in [place].  Patient has a daughter who lives in [place] and has 2 grandkids who are 21 and 54 year old.  Patient lives with his wife who is very supportive.  Legal History; Patient denies any current legal issues.  History Of Abuse; Patient denies any history of abuse.  Substance Abuse History; Patient admitted smoked marijuana 2-3 times a week but he also has medical card given in [place] to help his nausea related to Mnire's disease.  Neurologic: Headache:  sometimes headache Seizure: No Paresthesias: No   Outpatient Encounter Medications as of 02/21/2022  Medication Sig   albuterol (VENTOLIN HFA) 108 (90 Base) MCG/ACT inhaler Inhale 1 puff into the lungs every 6 (six) hours as needed for wheezing or shortness of breath.   ALPRAZolam (XANAX) 1 MG tablet Take 1 mg by mouth at bedtime.   amLODipine (NORVASC) 5 MG tablet Take 5 mg by mouth in the morning and at bedtime.   aspirin EC 81 MG tablet Take 81 mg by mouth daily. Swallow whole.   lisinopril (ZESTRIL) 40 MG tablet Take 40 mg by mouth daily.   metoprolol tartrate (LOPRESSOR) 50 MG tablet Take 0.5 tablets (25 mg total) by mouth 2 (two) times daily.   potassium chloride (KLOR-CON M) 20 MEQ tablet Take 1 tablet (20 mEq total) by mouth daily.   rosuvastatin (CRESTOR) 10 MG tablet Take 1 tablet (10 mg total) by mouth daily.   No facility-administered encounter medications on file as of 02/21/2022.    Recent Results (from the past 2160 hour(s))  NM Myocar Multi W/Spect W/Wall Motion / EF     Status: None   Collection Time: 12/29/21 11:58 AM  Result Value Ref Range   Angina Index 0    Rest HR 56.0 bpm   Rest BP 164/91 mmHg   Exercise duration (min) 4 min   Exercise duration (sec) 48 sec   Estimated workload 6.7    Peak HR 173 bpm   Peak BP  169/99 mmHg   MPHR 157 bpm   Percent HR 110.0 %   Rest Nuclear Isotope Dose 10.3 mCi   Stress Nuclear Isotope Dose 29.9 mCi   SSS 1.0    SRS 7.0    SDS 0.0    TID 1.00    LV sys vol 63.0 mL   LV dias vol 146.0 62 - 150 mL   Nuc Stress EF 57 %  CBC     Status: Abnormal   Collection Time: 01/03/22  9:36 AM  Result Value Ref Range   WBC 8.5 4.0 - 10.5 K/uL   RBC 5.66 4.22 - 5.81 MIL/uL   Hemoglobin 14.8 13.0 - 17.0 g/dL   HCT 74.0 81.4 - 48.1 %   MCV 79.3 (L) 80.0 - 100.0 fL   MCH 26.1 26.0 - 34.0 pg   MCHC 33.0 30.0 - 36.0 g/dL  RDW 14.8 11.5 - 15.5 %   Platelets 233 150 - 400 K/uL   nRBC 0.0 0.0 - 0.2 %    Comment: Performed at [facility], [address] [place], [place] [address]  Basic metabolic panel     Status: Abnormal   Collection Time: 01/03/22  9:36 AM  Result Value Ref Range   Sodium 141 135 - 145 mmol/L   Potassium 3.6 3.5 - 5.1 mmol/L   Chloride 110 98 - 111 mmol/L   CO2 24 22 - 32 mmol/L   Glucose, Bld 128 (H) 70 - 99 mg/dL    Comment: Glucose reference range applies only to samples taken after fasting for at least 8 hours.   BUN 14 8 - 23 mg/dL   Creatinine, Ser 0.93 0.61 - 1.24 mg/dL   Calcium 9.0 8.9 - 23.5 mg/dL   GFR, Estimated >57 >32 mL/min    Comment: (NOTE) Calculated using the CKD-EPI Creatinine Equation (2021)    Anion gap 7 5 - 15    Comment: Performed at [facility], [address] [place], [place] [address]  TSH     Status: None   Collection Time: 01/20/22  1:54 PM  Result Value Ref Range   TSH 1.410 0.450 - 4.500 uIU/mL  T4, free     Status: Abnormal   Collection Time: 01/20/22  1:54 PM  Result Value Ref Range   Free T4 0.72 (L) 0.82 - 1.77 ng/dL  T3, free     Status: None   Collection Time: 01/20/22  1:54 PM  Result Value Ref Range   T3, Free 3.1 2.0 - 4.4 pg/mL  Thyroid peroxidase antibody     Status: Abnormal   Collection Time: 01/20/22  1:54 PM  Result Value Ref Range   Thyroperoxidase Ab SerPl-aCnc 109  (H) 0 - 34 IU/mL  Thyroglobulin antibody     Status: Abnormal   Collection Time: 01/20/22  1:54 PM  Result Value Ref Range   Thyroglobulin Antibody 1.8 (H) 0.0 - 0.9 IU/mL    Comment: Thyroglobulin Antibody measured by Beckman Coulter Methodology      Constitutional:  There were no vitals taken for this visit.   Musculoskeletal: Strength & Muscle Tone: within normal limits Gait & Station: normal Patient leans: N/A  Psychiatric Specialty Exam: Physical Exam  ROS  There were no vitals taken for this visit.There is no height or weight on file to calculate BMI.  General Appearance: Fairly Groomed  Eye Contact:  Good  Speech:  Clear and Coherent  Volume:  Normal  Mood:  Anxious  Affect:  Congruent  Thought Process:  Descriptions of Associations: Intact  Orientation:  Full (Time, Place, and Person)  Thought Content:  WDL  Suicidal Thoughts:  No  Homicidal Thoughts:  No  Memory:  Immediate;   Good Recent;   Good Remote;   Good  Judgement:  Intact  Insight:  Present  Psychomotor Activity:  Tremor  Concentration:  Concentration: Good and Attention Span: Good  Recall:  Good  Fund of Knowledge:  Good  Language:  Good  Akathisia:  No  Handed:  Right  AIMS (if indicated):     Assets:  Communication Skills Desire for Improvement Housing Social Support Transportation  ADL's:  Intact  Cognition:  WNL  Sleep:   4 hrs     Assessment/Plan:  Patient is 61 year old married, currently on disability referred from PCP.  I reviewed current medication, blood work results, psychosocial history.  Patient is  taking Xanax 1 mg prescribed originally from primary care doctor in [place] for the management of Mnire's disease.  I explained in detail that Xanax is not indicated for Mnire's disease and he need to see either neurologist or ENT for the management of Mnire's disease.  Patient reported he has some anxiety and poor sleep and without Xanax he cannot sleep and having episodes of  severe nausea, ringing in his right ear.  I encouraged she should contact his PCP for the referral to manage Mnire's disease or to prescribe medicine for Mnire's disease.  He is also having tremors, palpitation which could be due to hyperparathyroidism and is going to see a endocrinologist in few days to address the symptoms.  I recommend try gabapentin 100 mg-200 mg to help his sleep and anxiety however this is not the management of Mnire's disease.  Patient and his wife concern as they are very low on Xanax and I agree to provide 10 tablets of Klonopin to take half tablet to avoid any withdrawals.  However I emphasized this is the only time we are giving Klonopin.  Discussed controlled substance dependency withdrawals and abuse.  We also talk about smoking marijuana and recommended should consider talking to his doctor about management of nausea rather than he is smoking marijuana.  I will forward my note to his PCP.  Patient is not interested in therapy.  Discuss safety concerns at any time having active suicidal thoughts or homicidal thought that he need to call 911 or go to local emergency room.  Follow-up in 4 weeks.  I have provided contact information if he has any question about medication side effects or refills.  Kathlee Nations, MD 02/21/2022    Follow Up Instructions: I discussed the assessment and treatment plan with the patient. The patient was provided an opportunity to ask questions and all were answered. The patient agreed with the plan and demonstrated an understanding of the instructions.   The patient was advised to call back or seek an in-person evaluation if the symptoms worsen or if the condition fails to improve as anticipated.   Collaboration of Care: Primary Care Provider AEB notes are available in epic to review   Patient/Guardian was advised Release of Information must be obtained prior to any record release in order to collaborate their care with an outside provider.  Patient/Guardian was advised if they have not already done so to contact the registration department to sign all necessary forms in order for [place] to release information regarding their care.    Consent: Patient/Guardian gives verbal consent for treatment and assignment of benefits for services provided during this visit. Patient/Guardian expressed understanding and agreed to proceed.     I provided 65 minutes of non-face-to-face time during this encounter.

## 2022-02-21 NOTE — Telephone Encounter (Signed)
Which labs would you like to be done on James Andersen? He had the full panel including antibodies last time.

## 2022-02-21 NOTE — Telephone Encounter (Signed)
Labs faxed to pts PCP

## 2022-02-25 ENCOUNTER — Other Ambulatory Visit: Payer: Medicare Other

## 2022-02-26 LAB — T3, FREE: T3, Free: 12.2 pg/mL — ABNORMAL HIGH (ref 2.0–4.4)

## 2022-02-26 LAB — T4, FREE: Free T4: 3.68 ng/dL — ABNORMAL HIGH (ref 0.82–1.77)

## 2022-02-26 LAB — TSH: TSH: <0.005 u[IU]/mL — ABNORMAL LOW (ref 0.450–4.500)

## 2022-02-28 ENCOUNTER — Other Ambulatory Visit: Payer: Self-pay | Admitting: Nurse Practitioner

## 2022-02-28 ENCOUNTER — Telehealth: Payer: Self-pay

## 2022-02-28 DIAGNOSIS — E0590 Thyrotoxicosis, unspecified without thyrotoxic crisis or storm: Secondary | ICD-10-CM

## 2022-02-28 MED ORDER — PREDNISONE 10 MG PO TABS: 10.0000 mg | ORAL_TABLET | Freq: Every day | ORAL | 0 refills | Status: DC

## 2022-02-28 NOTE — Telephone Encounter (Signed)
I scheduled pt a f/u with you to go over uptake results and labs on 8/21. He said he is having a hard time breathing, what do you suggest for this prior to scan?

## 2022-02-28 NOTE — Progress Notes (Signed)
Has he completely stopped the Methimazole yet? I see he is scheduled to have uptake and scan on the 16th but I dont think we can wait that long. His labs have reverted back and we need to start him on something to help slow it down and prevent thyroid storm.  Instead of starting back on Methimazole, I will give a short course of oral steroids that way the test can still be done.  I already sent the script to his pharmacy.

## 2022-02-28 NOTE — Telephone Encounter (Signed)
I sent James Andersen a message about him this morning.  His labs are significantly worse, that he has reverted back to hyperthyroidism.  I sent in Rx for prednisone for him to help slow his thyroid down so he can have the uptake and scan.

## 2022-02-28 NOTE — Telephone Encounter (Signed)
No other recommendations for now.  We will need to repeat his thyroid labs in about 2 weeks to see if his thyroid responded to the prednisone.  TSH, FT4, FT3

## 2022-02-28 NOTE — Telephone Encounter (Signed)
Sent pt my chart message with this info. Pt received msg and sent back message with his symptoms he has been having. Sent this to [employer]

## 2022-02-28 NOTE — Addendum Note (Signed)
Addended by: Dani Gobble on: 02/28/2022 07:16 AM   Modules accepted: Orders

## 2022-03-02 ENCOUNTER — Other Ambulatory Visit: Payer: Self-pay

## 2022-03-02 ENCOUNTER — Telehealth: Payer: Self-pay | Admitting: Nurse Practitioner

## 2022-03-02 DIAGNOSIS — E0590 Thyrotoxicosis, unspecified without thyrotoxic crisis or storm: Secondary | ICD-10-CM

## 2022-03-02 MED ORDER — METHIMAZOLE 10 MG PO TABS: 10.0000 mg | ORAL_TABLET | Freq: Two times a day (BID) | ORAL | 1 refills | Status: DC

## 2022-03-02 NOTE — Telephone Encounter (Signed)
We are going to need to put the uptake and scan on hold as he is likely impending thyroid storm if we dont treat with Methimazole.  Have him restart Methimazole 10 mg po twice daily and repeat thyroid labs now, asap.

## 2022-03-02 NOTE — Telephone Encounter (Signed)
Pts wife leslie is requesting that [name] call them. He did start the Steroids and is going to get repeat labs a few days prior to the uptake/ scan on 8/16. He has not taken the Methimazole and he states his symptoms are getting much worse. He feels very SOB which did not improve after taking the Steroids. [phone]

## 2022-03-02 NOTE — Telephone Encounter (Signed)
See phone note

## 2022-03-02 NOTE — Telephone Encounter (Signed)
Spoke to pts wife. They still have some methimazole tablets which he can take. They will do labs in the am at Dr Joaquin Music office. Faxing lab order to that office.

## 2022-03-03 ENCOUNTER — Ambulatory Visit: Payer: Medicare Other | Admitting: Nurse Practitioner

## 2022-03-03 ENCOUNTER — Telehealth: Payer: Self-pay

## 2022-03-03 NOTE — Telephone Encounter (Signed)
-----   Message from Dani Gobble, NP sent at 02/28/2022  7:16 AM EDT ----- Has he completely stopped the Methimazole yet? I see he is scheduled to have uptake and scan on the 16th but I dont think we can wait that long. His labs have reverted back and we need to start him on something to help slow it down and prevent thyroi d storm.  Instead of starting back on Methimazole, I will give a short course of oral steroids that way the test can still be done.  I already sent the script to his pharmacy.

## 2022-03-03 NOTE — Telephone Encounter (Signed)
Pt notified and agrees. 

## 2022-03-09 ENCOUNTER — Other Ambulatory Visit: Payer: Self-pay | Admitting: Physician Assistant

## 2022-03-14 ENCOUNTER — Ambulatory Visit: Payer: Medicare Other | Admitting: Nurse Practitioner

## 2022-03-15 ENCOUNTER — Other Ambulatory Visit: Payer: Medicare Other

## 2022-03-16 ENCOUNTER — Telehealth (HOSPITAL_COMMUNITY): Payer: Medicare Other | Admitting: Psychiatry

## 2022-03-18 ENCOUNTER — Other Ambulatory Visit: Payer: Self-pay | Admitting: Nurse Practitioner

## 2022-03-18 DIAGNOSIS — E0590 Thyrotoxicosis, unspecified without thyrotoxic crisis or storm: Secondary | ICD-10-CM

## 2022-03-18 LAB — T4, FREE: Free T4: 1.26 ng/dL (ref 0.82–1.77)

## 2022-03-18 LAB — T3, FREE: T3, Free: 3.6 pg/mL (ref 2.0–4.4)

## 2022-03-18 LAB — TSH: TSH: <0.005 u[IU]/mL — ABNORMAL LOW (ref 0.450–4.500)

## 2022-03-18 NOTE — Progress Notes (Signed)
Labs look much better now.  Have him lower his Methimazole to 5 mg twice daily and we will repeat labs in 4 weeks to assess if we can stop and do the uptake and scan at that time.

## 2022-05-03 DIAGNOSIS — Z1211 Encounter for screening for malignant neoplasm of colon: Secondary | ICD-10-CM

## 2022-06-13 ENCOUNTER — Other Ambulatory Visit: Payer: Self-pay | Admitting: Physician Assistant

## 2022-06-13 ENCOUNTER — Other Ambulatory Visit: Payer: Self-pay | Admitting: Nurse Practitioner

## 2022-06-13 DIAGNOSIS — E0590 Thyrotoxicosis, unspecified without thyrotoxic crisis or storm: Secondary | ICD-10-CM

## 2022-06-13 NOTE — Telephone Encounter (Signed)
Will need an office visit for future refills

## 2022-06-20 ENCOUNTER — Telehealth: Payer: Self-pay | Admitting: *Deleted

## 2022-06-20 ENCOUNTER — Other Ambulatory Visit: Payer: Self-pay | Admitting: Nurse Practitioner

## 2022-06-20 DIAGNOSIS — E0590 Thyrotoxicosis, unspecified without thyrotoxic crisis or storm: Secondary | ICD-10-CM

## 2022-06-20 MED ORDER — METHIMAZOLE 10 MG PO TABS: 5.0000 mg | ORAL_TABLET | Freq: Two times a day (BID) | ORAL | 1 refills | Status: DC

## 2022-06-20 NOTE — Telephone Encounter (Signed)
I sent in a refill for him.

## 2022-06-20 NOTE — Telephone Encounter (Signed)
Patient was called and made aware. 

## 2022-06-20 NOTE — Telephone Encounter (Signed)
Patient states that he was to have had appointment with Alphonzo Lemmings about a month ago. He had a Thyroid Storm and this was followed by [facility]. He went to the Lab this morning to have lab work drawn. He is requesting that his appointment be after the hoildays. He will need his Methimazole refilled , he only has two left. This will be sent to the [facility] in [place] on [address].

## 2022-06-21 LAB — T4, FREE: Free T4: 0.65 ng/dL — ABNORMAL LOW (ref 0.82–1.77)

## 2022-06-21 LAB — T3, FREE: T3, Free: 2.5 pg/mL (ref 2.0–4.4)

## 2022-06-21 LAB — TSH: TSH: 20.1 u[IU]/mL — ABNORMAL HIGH (ref 0.450–4.500)

## 2022-06-21 NOTE — Telephone Encounter (Signed)
I have already entered his lab orders.  See patient note to schedule appt.

## 2022-06-21 NOTE — Progress Notes (Signed)
His labs show he has continued to respond well to the Methimazole.  So much so, his thyroid appears to be in the under active range now.  Meaning, we need to reduce his Methimazole further to 5 mg po once daily (instead of twice daily).  Then we need to follow up after Christmas with more labs to determine if it is ok to stop the medication altogether.  I will enter the labs.

## 2022-06-21 NOTE — Progress Notes (Signed)
Patient was called and given James Andersen's recommendation. 

## 2022-06-21 NOTE — Addendum Note (Signed)
Addended by: Dani Gobble on: 06/21/2022 07:51 AM   Modules accepted: Orders

## 2022-08-02 ENCOUNTER — Ambulatory Visit: Payer: Medicare Other | Admitting: Nurse Practitioner

## 2022-08-05 LAB — T3, FREE: T3, Free: 2.9 pg/mL (ref 2.0–4.4)

## 2022-08-05 LAB — T4, FREE: Free T4: 0.94 ng/dL (ref 0.82–1.77)

## 2022-08-05 LAB — TSH: TSH: 2.91 u[IU]/mL (ref 0.450–4.500)

## 2022-08-10 ENCOUNTER — Encounter: Payer: Self-pay | Admitting: Nurse Practitioner

## 2022-08-10 ENCOUNTER — Ambulatory Visit (INDEPENDENT_AMBULATORY_CARE_PROVIDER_SITE_OTHER): Payer: Medicare Other | Admitting: Nurse Practitioner

## 2022-08-10 VITALS — BP 134/84 | HR 53 | Ht 68.0 in | Wt 248.6 lb

## 2022-08-10 DIAGNOSIS — E0590 Thyrotoxicosis, unspecified without thyrotoxic crisis or storm: Secondary | ICD-10-CM

## 2022-08-10 NOTE — Progress Notes (Signed)
08/10/2022     Endocrinology Follow Up Note    Subjective:    Patient ID: James Andersen, male    DOB: 09/17/1960, PCP Mayer Masker, PA-C.   Past Medical History:  Diagnosis Date   High cholesterol    Hypertension    Meniere's disease    Thyroid disease     Past Surgical History:  Procedure Laterality Date   HERNIA REPAIR     LEFT HEART CATH AND CORONARY ANGIOGRAPHY N/A 01/10/2022   Procedure: LEFT HEART CATH AND CORONARY ANGIOGRAPHY;  Surgeon: Iran Ouch, MD;  Location: [facility];  Service: Cardiovascular;  Laterality: N/A;   VASECTOMY      Social History   Socioeconomic History   Marital status: Married    Spouse name: Verlon Au   Number of children: 1   Years of education: Not on file   Highest education level: Not on file  Occupational History   Not on file  Tobacco Use   Smoking status: Former    Packs/day: 1.00    Years: 30.00    Total pack years: 30.00    Types: Cigarettes    Quit date: 02/22/2017    Years since quitting: 5.4   Smokeless tobacco: Never  Vaping Use   Vaping Use: Never used  Substance and Sexual Activity   Alcohol use: Not Currently   Drug use: Yes    Types: Marijuana   Sexual activity: Not Currently  Other Topics Concern   Not on file  Social History Narrative   Not on file   Social Determinants of Health   Financial Resource Strain: Medium Risk (02/02/2022)   Overall Financial Resource Strain (CARDIA)    Difficulty of Paying Living Expenses: Somewhat hard  Food Insecurity: No Food Insecurity (02/02/2022)   Hunger Vital Sign    Worried About Running Out of Food in the Last Year: Never true    Ran Out of Food in the Last Year: Never true  Transportation Needs: No Transportation Needs (02/02/2022)   PRAPARE - Administrator, Civil Service (Medical): No    Lack of Transportation (Non-Medical): No  Physical Activity: Sufficiently Active (02/02/2022)   Exercise Vital Sign    Days of  Exercise per Week: 7 days    Minutes of Exercise per Session: 60 min  Stress: Stress Concern Present (02/02/2022)   [employer] of Occupational Health - Occupational Stress Questionnaire    Feeling of Stress : To some extent  Social Connections: Moderately Isolated (02/02/2022)   Social Connection and Isolation Panel [NHANES]    Frequency of Communication with Friends and Family: Three times a week    Frequency of Social Gatherings with Friends and Family: More than three times a week    Attends Religious Services: Never    Database administrator or Organizations: No    Attends Banker Meetings: Never    Marital Status: Married    Family History  Problem Relation Age of Onset   Cancer Mother    Cancer Father     Outpatient Encounter Medications as of 08/10/2022  Medication Sig   albuterol (VENTOLIN HFA) 108 (90 Base) MCG/ACT inhaler Inhale 1 puff into the lungs every 6 (six) hours as needed for wheezing or shortness of breath.   amLODipine (NORVASC) 5 MG tablet Take 5 mg by mouth in the morning and at bedtime.   aspirin EC 81 MG tablet Take 81 mg by mouth daily. Swallow whole.  lisinopril (ZESTRIL) 40 MG tablet Take 40 mg by mouth daily.   methimazole (TAPAZOLE) 10 MG tablet Take 0.5 tablets (5 mg total) by mouth 2 (two) times daily.   metoprolol tartrate (LOPRESSOR) 50 MG tablet Take 0.5 tablets (25 mg total) by mouth 2 (two) times daily.   rosuvastatin (CRESTOR) 10 MG tablet TAKE ONE TABLET BY MOUTH ONE TIME DAILY   clonazePAM (KLONOPIN) 0.5 MG tablet Take 1/2 to one tab as needed for anxiety/withdrawal symptoms. NTE one tablet (0.5 mg) per day PRN. (Patient not taking: Reported on 08/10/2022)   gabapentin (NEURONTIN) 100 MG capsule Take 1-2 capsules (100-200 mg total) by mouth at bedtime. (Patient not taking: Reported on 08/10/2022)   potassium chloride (KLOR-CON M) 20 MEQ tablet Take 1 tablet (20 mEq total) by mouth daily. (Patient not taking: Reported on 08/10/2022)    predniSONE (DELTASONE) 10 MG tablet Take 1 tablet (10 mg total) by mouth daily with breakfast. (Patient not taking: Reported on 08/10/2022)   No facility-administered encounter medications on file as of 08/10/2022.    ALLERGIES: No Known Allergies  VACCINATION STATUS:  There is no immunization history on file for this patient.   HPI  James Andersen is 62 y.o. male who presents today with a medical history as above. he is being seen in follow up after being seen in consultation for hyperthyroidism requested by Lorrene Reid, PA-C.  he has been dealing with symptoms of tremors, unintentional weight loss, diarrhea, insomnia, anxiety, and SOB for 4  months. These symptoms are progressively worsening and troubling to him.  his most recent thyroid labs revealed suppressed TSH of < 0.005 and elevated FT4 of 2.16 on 11/22/21 where he was started on Methimazole and now titrated up to 10 mg po BID.  He had a thyroid ultrasound performed in March 2023 which was normal.  Since that time, he has been slowly weaning of his Methimazole, currently on 5 mg po daily.  he denies dysphagia, choking, shortness of breath, no recent voice change.    he denies known family history of thyroid dysfunction and denies family hx of thyroid cancer. he denies personal history of goiter. Denies use of Biotin containing supplements.  he is willing to proceed with appropriate work up and therapy for thyrotoxicosis.   Review of systems  Constitutional: + Minimally fluctuating body weight,  current Body mass index is 37.8 kg/m. , no fatigue, no subjective hyperthermia, no subjective hypothermia Eyes: no blurry vision, no xerophthalmia ENT: no sore throat, no nodules palpated in throat, no dysphagia/odynophagia, no hoarseness Cardiovascular: no chest pain, no shortness of breath, no palpitations, no leg swelling Respiratory: no cough, no shortness of breath Gastrointestinal: no  nausea/vomiting/diarrhea Musculoskeletal: no muscle/joint aches Skin: no rashes, no hyperemia Neurological: no tremors, no numbness, no tingling, no dizziness Psychiatric: no depression, no anxiety   Objective:    BP 134/84 (BP Location: Left Arm, Patient Position: Sitting, Cuff Size: Large)   Pulse (!) 53   Ht 5\' 8"  (1.727 m)   Wt 248 lb 9.6 oz (112.8 kg)   BMI 37.80 kg/m   Wt Readings from Last 3 Encounters:  08/10/22 248 lb 9.6 oz (112.8 kg)  02/02/22 233 lb (105.7 kg)  01/20/22 232 lb (105.2 kg)     BP Readings from Last 3 Encounters:  08/10/22 134/84  02/02/22 134/81  01/20/22 (!) 145/87  Physical Exam- Limited  Constitutional:  Body mass index is 37.8 kg/m. , not in acute distress, normal state of mind Eyes:  EOMI, no exophthalmos Musculoskeletal: no gross deformities, strength intact in all four extremities, no gross restriction of joint movements Skin:  no rashes, no hyperemia Neurological: no tremor with outstretched hands   CMP     Component Value Date/Time   NA 141 01/03/2022 0936   NA 142 11/22/2021 1506   K 3.6 01/03/2022 0936   CL 110 01/03/2022 0936   CO2 24 01/03/2022 0936   GLUCOSE 128 (H) 01/03/2022 0936   BUN 14 01/03/2022 0936   BUN 8 11/22/2021 1506   CREATININE 1.16 01/03/2022 0936   CALCIUM 9.0 01/03/2022 0936   PROT 7.0 11/22/2021 1506   ALBUMIN 4.3 11/22/2021 1506   AST 12 11/22/2021 1506   ALT 13 11/22/2021 1506   ALKPHOS 93 11/22/2021 1506   BILITOT 0.6 11/22/2021 1506   GFRNONAA >60 01/03/2022 0936     CBC    Component Value Date/Time   WBC 8.5 01/03/2022 0936   RBC 5.66 01/03/2022 0936   HGB 14.8 01/03/2022 0936   HGB 13.8 11/22/2021 1506   HCT 44.9 01/03/2022 0936   HCT 42.1 11/22/2021 1506   PLT 233 01/03/2022 0936   PLT 285 11/22/2021 1506   MCV 79.3 (L) 01/03/2022 0936   MCV 80 11/22/2021 1506   MCH 26.1 01/03/2022 0936   MCHC 33.0 01/03/2022 0936   RDW 14.8 01/03/2022 0936   RDW 14.3  11/22/2021 1506   LYMPHSABS 2.0 11/22/2021 1506   EOSABS 0.3 11/22/2021 1506   BASOSABS 0.1 11/22/2021 1506     Diabetic Labs (most recent): Lab Results  Component Value Date   HGBA1C 5.7 (H) 10/17/2021    Lipid Panel     Component Value Date/Time   CHOL 87 10/17/2021 0321   TRIG 73 10/17/2021 0321   HDL 24 (L) 10/17/2021 0321   CHOLHDL 3.6 10/17/2021 0321   VLDL 15 10/17/2021 0321   LDLCALC 48 10/17/2021 0321     Lab Results  Component Value Date   TSH 2.910 08/04/2022   TSH 20.100 (H) 06/20/2022   TSH <0.005 (L) 03/17/2022   TSH <0.005 (L) 02/25/2022   TSH 1.410 01/20/2022   TSH <0.005 (L) 11/22/2021   TSH <0.010 (L) 10/16/2021   FREET4 0.94 08/04/2022   FREET4 0.65 (L) 06/20/2022   FREET4 1.26 03/17/2022   FREET4 3.68 (H) 02/25/2022   FREET4 0.72 (L) 01/20/2022   FREET4 2.16 (H) 11/22/2021   FREET4 4.79 (H) 10/16/2021      Latest Reference Range & Units 10/16/21 04:43 10/16/21 13:07 11/22/21 15:06 01/20/22 13:54 02/25/22 09:32 03/17/22 10:58 06/20/22 10:11 08/04/22 10:21  TSH 0.450 - 4.500 uIU/mL <0.010 (L)  <0.005 (L) 1.410 <0.005 (L) <0.005 (L) 20.100 (H) 2.910  Triiodothyronine,Free,Serum 2.0 - 4.4 pg/mL  15.6 (H)  3.1 12.2 (H) 3.6 2.5 2.9  Triiodothyronine (T3) 71 - 180 ng/dL   199 (H)       T4,Free(Direct) 0.82 - 1.77 ng/dL  4.79 (H) 2.16 (H) 0.72 (L) 3.68 (H) 1.26 0.65 (L) 0.94  Thyroperoxidase Ab SerPl-aCnc 0 - 34 IU/mL    109 (H)      Thyroglobulin Antibody 0.0 - 0.9 IU/mL    1.8 (H)      (L): Data is abnormally low (H): Data is abnormally high   Assessment & Plan:   1. Hyperthyroidism- r/t Graves disease  he is being seen at a  kind request of Mayer Masker, PA-C.  His thyroid antibodies were positive, indicating autoimmune thyroid dysfunction.  His repeat thyroid function tests show he has responded nicely to the Methimazole, so much so we can stop and proceed with uptake and scan.  He is nervous about stopping the Methimazole as he had  thyroid storm in the past which really scared him, so he will be waiting to stop the Methimazole until after he has the uptake and scan scheduled.  He knows he has to be off Methimazole for 5 days prior to the test to prevent altering his results.  We discussed the potential outcomes of the test and what that would mean moving forward.  If his uptake and scan is high (which I suspect will be), the most effective treatment method would be RAI ablation.  Treatment with Methimazole can put him in remission but the recurrence rate is pretty high and the SE of the medication itself is less than desirable.  Will call patient with results of uptake and scan and plan moving forward.     -Patient is advised to maintain close follow up with Mayer Masker, PA-C for primary care needs.    I spent 44 minutes in the care of the patient today including review of labs from Thyroid Function, CMP, and other relevant labs ; imaging/biopsy records (current and previous including abstractions from other facilities); face-to-face time discussing  his lab results and symptoms, medications doses, his options of short and long term treatment based on the latest standards of care / guidelines;   and documenting the encounter.  James Andersen  participated in the discussions, expressed understanding, and voiced agreement with the above plans.  All questions were answered to his satisfaction. he is encouraged to contact clinic should he have any questions or concerns prior to his return visit.  Follow up plan: Return in about 1 month (around 09/10/2022) for Thyroid follow up, thyroid uptake and scan.   Thank you for involving me in the care of this pleasant patient, and I will continue to update you with his progress.    Ronny Bacon, [facility] [address] [place], [place] [address] Phone: [phone] Fax: [phone]  08/10/2022, 2:35 PM

## 2022-08-22 ENCOUNTER — Encounter
Admission: RE | Admit: 2022-08-22 | Discharge: 2022-08-22 | Disposition: A | Discharge status: Home / Self Care | Payer: Medicare Other | Source: Ambulatory Visit | Attending: Nurse Practitioner | Admitting: Nurse Practitioner

## 2022-08-22 DIAGNOSIS — E0500 Thyrotoxicosis with diffuse goiter without thyrotoxic crisis or storm: Secondary | ICD-10-CM | POA: Diagnosis not present

## 2022-08-22 DIAGNOSIS — R0602 Shortness of breath: Secondary | ICD-10-CM | POA: Insufficient documentation

## 2022-08-22 DIAGNOSIS — K5900 Constipation, unspecified: Secondary | ICD-10-CM | POA: Diagnosis not present

## 2022-08-22 DIAGNOSIS — E0590 Thyrotoxicosis, unspecified without thyrotoxic crisis or storm: Secondary | ICD-10-CM | POA: Diagnosis present

## 2022-08-22 DIAGNOSIS — R002 Palpitations: Secondary | ICD-10-CM | POA: Insufficient documentation

## 2022-08-22 DIAGNOSIS — R450 Nervousness: Secondary | ICD-10-CM | POA: Insufficient documentation

## 2022-08-22 MED ORDER — SODIUM IODIDE I-123 7.4 MBQ CAPS
438.7000 | ORAL_CAPSULE | Freq: Once | ORAL | Status: AC
  Administered 2022-08-22: 438.7 via ORAL

## 2022-08-23 ENCOUNTER — Encounter
Admission: RE | Admit: 2022-08-23 | Discharge: 2022-08-23 | Disposition: A | Discharge status: Home / Self Care | Payer: Medicare Other | Source: Ambulatory Visit | Attending: Nurse Practitioner | Admitting: Nurse Practitioner

## 2022-08-23 ENCOUNTER — Other Ambulatory Visit: Payer: Medicare Other

## 2022-08-23 NOTE — Addendum Note (Signed)
Addended by: Brita Romp on: 08/23/2022 10:51 AM   Modules accepted: Orders

## 2022-08-23 NOTE — Progress Notes (Signed)
Can you push the patients follow up appointment out for 10 weeks from today?  I went ahead and called him to go over the uptake and scan and put in the order to ablate.

## 2022-09-05 ENCOUNTER — Telehealth: Payer: Self-pay | Admitting: Radiology

## 2022-09-05 NOTE — Written Directive (Addendum)
MOLECULAR IMAGING AND THERAPEUTICS WRITTEN DIRECTIVE   PATIENT NAME: James Andersen  PT DOB:   08-Jan-1961                                              MRN: [id number]  ---------------------------------------------------------------------------------------------------------------------   I-131 WHOLE THYROID THERAPY (NON-CANCER)    RADIOPHARMACEUTICAL:   Iodine-131 Capsule    PRESCRIBED DOSE FOR ADMINISTRATION: [id number] millicurries    ROUTE OFADMINISTRATION: PO   DIAGNOSIS:  Hyperthyroidism, E05.90        Thyroid Uptake : Findings consistent with Graves disease-Dr Thornton Papas   REFERRING PHYSICIAN: Rayetta Pigg NP   TSH:    Lab Results  Component Value Date   TSH 2.910 08/04/2022   TSH 20.100 (H) 06/20/2022   TSH <0.005 (L) 03/17/2022     PRIOR I-131 THERAPY (Date and Dose):   PRIOR RADIOLOGY EXAMS (Results and Date): NM THYROID MULT UPTAKE W/IMAGING  Result Date: 08/23/2022 CLINICAL DATA:  Hyperthyroidism, E05.90 EXAM: THYROID SCAN AND UPTAKE - 4 AND 24 HOURS TECHNIQUE: Following oral administration of I-123 capsule, anterior planar imaging was acquired at 24 hours. Thyroid uptake was calculated with a thyroid probe at 4-6 hours and 24 hours. RADIOPHARMACEUTICALS:  438.7 uCi I-123 sodium iodide p.o. COMPARISON:  Thyroid ultrasound 10/16/2021 FINDINGS: Homogeneous tracer distribution in both thyroid lobes. Faint pyramidal lobe. No focal areas of increased or decreased tracer localization. 4 hour I-123 uptake = 15.1% (normal 5-20%) 24 hour I-123 uptake = 41.5% (normal 10-30%) IMPRESSION: Elevated 24 hour radio iodine uptake of 41.5% with homogeneous tracer distribution in both thyroid lobes. Findings consistent with Graves disease. Electronically Signed   By: Lavonia Dana M.D.   On: 08/23/2022 10:39   US THYROID  Result Date: 10/16/2021 CLINICAL DATA:  Hyperthyroidism EXAM: THYROID ULTRASOUND TECHNIQUE: Ultrasound examination of the thyroid gland and adjacent soft tissues  was performed. COMPARISON:  None. FINDINGS: Parenchymal Echotexture: Normal Isthmus: 0.4 cm Right lobe: 4.3 x 1.9 x 1.8 cm Left lobe: 4.5 x 1.7 x 1.4 cm _________________________________________________________ Estimated total number of nodules >/= 1 cm: 0 Number of spongiform nodules >/=  2 cm not described below (TR1): 0 Number of mixed cystic and solid nodules >/= 1.5 cm not described below (TR2): 0 _________________________________________________________ No discrete nodules are seen within the thyroid gland. Mildly prominent right neck lymph nodes not enlarged by imaging criteria. IMPRESSION: No significant sonographic abnormality of the thyroid. The above is in keeping with the ACR TI-RADS recommendations - J Am Coll Radiol 2017;14:587-595. Electronically Signed   By: Miachel Roux M.D.   On: 10/16/2021 20:49      ADDITIONAL PHYSICIAN COMMENTS/NOTES:  Pt can have therapy on 09/16/22    AUTHORIZED USER SIGNATURE & TIME STAMP:

## 2022-09-13 ENCOUNTER — Ambulatory Visit: Payer: Medicare Other | Admitting: Nurse Practitioner

## 2022-09-14 ENCOUNTER — Telehealth: Payer: Self-pay | Admitting: Nurse Practitioner

## 2022-09-14 MED ORDER — ROSUVASTATIN CALCIUM 10 MG PO TABS: 10.0000 mg | ORAL_TABLET | Freq: Every day | ORAL | 0 refills | Status: DC

## 2022-09-14 NOTE — Telephone Encounter (Signed)
30 day sent

## 2022-09-14 NOTE — Telephone Encounter (Signed)
L.O.V: 02/02/22  N.O.V: Not scheduled   L.R.F: 06/13/22 Rosuvstatin 90 tab 0 refill  OV required.

## 2022-09-14 NOTE — Telephone Encounter (Signed)
Pt scheduled appt for 09/29/22.

## 2022-09-16 ENCOUNTER — Encounter
Admission: RE | Admit: 2022-09-16 | Discharge: 2022-09-16 | Disposition: A | Discharge status: Home / Self Care | Payer: Medicare Other | Source: Ambulatory Visit | Attending: Nurse Practitioner | Admitting: Nurse Practitioner

## 2022-09-16 DIAGNOSIS — E0590 Thyrotoxicosis, unspecified without thyrotoxic crisis or storm: Secondary | ICD-10-CM | POA: Insufficient documentation

## 2022-09-16 MED ORDER — SODIUM IODIDE I 131 CAPSULE
14.8000 | Freq: Once | INTRAVENOUS | Status: AC | PRN
  Administered 2022-09-16: 14.8 via ORAL

## 2022-09-29 ENCOUNTER — Encounter: Payer: Self-pay | Admitting: Family Medicine

## 2022-09-29 ENCOUNTER — Ambulatory Visit (INDEPENDENT_AMBULATORY_CARE_PROVIDER_SITE_OTHER): Payer: Medicare Other | Admitting: Family Medicine

## 2022-09-29 VITALS — BP 144/83 | HR 53 | Resp 18 | Ht 68.0 in | Wt 247.0 lb

## 2022-09-29 DIAGNOSIS — E785 Hyperlipidemia, unspecified: Secondary | ICD-10-CM

## 2022-09-29 DIAGNOSIS — I10 Essential (primary) hypertension: Secondary | ICD-10-CM

## 2022-09-29 DIAGNOSIS — R7303 Prediabetes: Secondary | ICD-10-CM

## 2022-09-29 DIAGNOSIS — H8101 Meniere's disease, right ear: Secondary | ICD-10-CM | POA: Diagnosis not present

## 2022-09-29 DIAGNOSIS — G4700 Insomnia, unspecified: Secondary | ICD-10-CM

## 2022-09-29 MED ORDER — EZETIMIBE 10 MG PO TABS: 10.0000 mg | ORAL_TABLET | Freq: Every day | ORAL | 1 refills | Status: DC

## 2022-09-29 MED ORDER — ALPRAZOLAM 1 MG PO TABS: ORAL_TABLET | ORAL | 0 refills | Status: DC

## 2022-09-29 MED ORDER — BETAHISTINE HCL POWD: 8.0000 mg | Freq: Three times a day (TID) | 1 refills | Status: DC

## 2022-09-29 NOTE — Assessment & Plan Note (Signed)
Last lipid panel in 2023: LDL 48, HDL 24.  Collecting repeat lipid panel today.  Due to side effects of rosuvastatin, will discontinue and switch patient to Zetia.  Repeat lipid panel at follow-up visit to check for efficacy of Zetia and will adjust medication as indicated.  Encouraged heart healthy diet.  Will continue to monitor.

## 2022-09-29 NOTE — Assessment & Plan Note (Signed)
Patient believes that the insomnia is secondary to the symptoms of Mnire's disease.  We discussed tapering off of the Xanax and starting a different medication to control the Mnire's disease.  If he does still need something to help him sleep in combination with the betahistine, then we discussed some other options such as trazodone.  At his follow-up appointment, we will consider starting a sleep medication if needed.  Until then, taper off of Xanax.  Patient expressed understanding and is agreeable to this plan.

## 2022-09-29 NOTE — Assessment & Plan Note (Signed)
Recommended discontinuing Xanax as he has been on it for many years and there are likely more effective treatment options available now.  Explained and provided written instructions for benzodiazepine taper over the next several weeks, patient verbalized understanding.  Provided Xanax refill with enough to get him through the taper.  Starting betahistine 8 mg 3 times a day for Mnire's disease.  He can increase this to up to 16 mg 3 times a day as tolerated.  Provided printed information on betahistine from up-to-date for him to review at home.  We also discussed the importance of decreasing salt intake, caffeine and alcohol consumption, and stress is much as he can.  Vestibular rehabilitation is also an option if he was interested in that.  Patient would like to start with the new medication.  At follow-up, will discuss the efficacy of the new medicine as well as reevaluate his blood pressure to see if any changes need to be made.  Patient verbalized understanding and is agreeable to this plan.

## 2022-09-29 NOTE — Assessment & Plan Note (Signed)
Collecting A1c today.  Encourage low-carb diet and regular exercise routine.  Will continue to monitor.

## 2022-09-29 NOTE — Progress Notes (Signed)
Established Patient Office Visit  Subjective   Patient ID: James Andersen, male    DOB: 01/28/1961  Age: 62 y.o. MRN: [id number]  Chief Complaint  Patient presents with   Medical Management of Chronic Issues   Hyperlipidemia    HPI Rhon Bloome is a 62 y.o. male presenting today for follow up of hypertension, hyperlipidemia, prediabetes.  He also wants to discuss his Meniere's disease and medication options.  He has a follow-up appointment next week to have thyroid labs drawn as he recently took the radiation pill, monitored by endocrinology. Hypertension: Patient here for follow-up of elevated blood pressure. Pt denies chest pain, SOB, dizziness, edema, syncope, fatigue or heart palpitations. Taking lisinopril, metoprolol, amlodipine, reports excellent compliance with treatment. Denies side effects. Hyperlipidemia: taking rosuvastatin but has been experiencing joint pain especially in his knees.  He previously tried another statin medication in the past with a similar side effect and is interested in switching. Prediabetes: denies hypoglycemic events, wounds or sores that are not healing well, increased thirst or urination.  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  He has usually about 1 episode per month with debilitating vertigo.  While he was living in [place], he tried several medication options and found that taking 0.5 mg of Xanax twice daily was helpful for his symptoms and helped him sleep.  He has been taking Xanax for many years and is interested in what other options there might be for his ears.  ROS Negative unless otherwise noted in HPI   Objective:     BP (!) 144/83 (BP Location: Left Arm, Patient Position: Sitting, Cuff Size: Large)   Pulse (!) 53   Resp 18   Ht '5\' 8"'$  (1.727 m)   Wt 247 lb (112 kg)   SpO2 94%   BMI 37.56 kg/m   Physical Exam Constitutional:      General: He is not in acute  distress.    Appearance: Normal appearance.  HENT:     Head: Normocephalic and atraumatic.  Neck:     Thyroid: No thyroid mass, thyromegaly or thyroid tenderness.  Cardiovascular:     Rate and Rhythm: Normal rate and regular rhythm.     Pulses: Normal pulses.     Heart sounds: Normal heart sounds.  Pulmonary:     Effort: Pulmonary effort is normal.     Breath sounds: Normal breath sounds.  Musculoskeletal:     Cervical back: Normal range of motion. No tenderness.  Lymphadenopathy:     Cervical: No cervical adenopathy.  Skin:    General: Skin is warm and dry.  Neurological:     Mental Status: He is alert and oriented to person, place, and time.  Psychiatric:        Mood and Affect: Mood normal.     Assessment & Plan:  Essential hypertension Assessment & Plan: Blood pressure slightly elevated on presentation 144/83.  Discussed the importance of medication compliance and low-salt diet both for his hypertension and Mnire's disease.  We may consider adding a medication like hydrochlorothiazide-triamterene or furosemide for both his blood pressure and Mnire's disease in the future, will continue to monitor.  Collecting CMP for medication monitoring today.  Orders: -     CBC with Differential/Platelet; Future -     Comprehensive metabolic panel; Future  Pre-diabetes Assessment & Plan: Collecting A1c today.  Encourage low-carb diet and regular exercise routine.  Will continue to monitor.  Orders: -  Hemoglobin A1c; Future  Hyperlipidemia, unspecified hyperlipidemia type Assessment & Plan: Last lipid panel in 2023: LDL 48, HDL 24.  Collecting repeat lipid panel today.  Due to side effects of rosuvastatin, will discontinue and switch patient to Zetia.  Repeat lipid panel at follow-up visit to check for efficacy of Zetia and will adjust medication as indicated.  Encouraged heart healthy diet.  Will continue to monitor.  Orders: -     Lipid panel; Future -     Ezetimibe;  Take 1 tablet (10 mg total) by mouth daily.  Dispense: 90 tablet; Refill: 1  Meniere disease, right Assessment & Plan: Recommended discontinuing Xanax as he has been on it for many years and there are likely more effective treatment options available now.  Explained and provided written instructions for benzodiazepine taper over the next several weeks, patient verbalized understanding.  Provided Xanax refill with enough to get him through the taper.  Starting betahistine 8 mg 3 times a day for Mnire's disease.  He can increase this to up to 16 mg 3 times a day as tolerated.  Provided printed information on betahistine from up-to-date for him to review at home.  We also discussed the importance of decreasing salt intake, caffeine and alcohol consumption, and stress is much as he can.  Vestibular rehabilitation is also an option if he was interested in that.  Patient would like to start with the new medication.  At follow-up, will discuss the efficacy of the new medicine as well as reevaluate his blood pressure to see if any changes need to be made.  Patient verbalized understanding and is agreeable to this plan.  Orders: -     Betahistine HCl; Take 8 mg by mouth 3 (three) times daily. Can increase to 16 mg by mouth 3 times daily as tolerated.  Dispense: 1 g; Refill: 1 -     ALPRAZolam; Follow tapering instructions included in your after visit summary.  Dispense: 30 tablet; Refill: 0  Insomnia, unspecified type Assessment & Plan: Patient believes that the insomnia is secondary to the symptoms of Mnire's disease.  We discussed tapering off of the Xanax and starting a different medication to control the Mnire's disease.  If he does still need something to help him sleep in combination with the betahistine, then we discussed some other options such as trazodone.  At his follow-up appointment, we will consider starting a sleep medication if needed.  Until then, taper off of Xanax.  Patient expressed  understanding and is agreeable to this plan.  Orders: -     ALPRAZolam; Follow tapering instructions included in your after visit summary.  Dispense: 30 tablet; Refill: 0  Return in about 6 weeks (around 11/10/2022) for follow-up.   I spent 50 minutes on the day of the encounter to include pre-visit record review, face-to-face time with the patient and post visit ordering of test.  Velva Harman, PA

## 2022-09-29 NOTE — Assessment & Plan Note (Signed)
Blood pressure slightly elevated on presentation 144/83.  Discussed the importance of medication compliance and low-salt diet both for his hypertension and Mnire's disease.  We may consider adding a medication like hydrochlorothiazide-triamterene or furosemide for both his blood pressure and Mnire's disease in the future, will continue to monitor.  Collecting CMP for medication monitoring today.

## 2022-09-29 NOTE — Patient Instructions (Addendum)
Taper off of Xanax over the next several weeks as tolerated: Week 1: Alternate taking your usual dose twice daily every other day and once daily the other days.  If you are tolerating this well, you can proceed to week 2.  If you are not, extend this regimen for an extra week. Week 2: Take your usual dose 0.5 mg once a day.  If you are tolerating this well, you can proceed to week 3.  If you are not, extend this regimen for an extra week. Week 3: Take your usual dose 0.5 mg once daily every other day.  If you are tolerating this well you can proceed to week 4.  If you are not, extend this regimen for an extra week. Week 4: Take your usual dose 0.5 mg once daily every third day.  If you are tolerating this well you can proceed to week for.  If you are not, extend this regimen for an extra week.  Meniere's disease: I have included a handout with some more information about other things that you continue to help with your symptoms.  This includes limiting the amount of salt you are eating, caffeine and alcohol you are consuming, and reducing stress in your life as best you can. There is also the option to do some vestibular rehabilitation if you are interested in that.  We can discuss that and potentially send a referral at your next appointment once we see how you are doing with the new medication routine.

## 2022-10-01 LAB — CBC WITH DIFFERENTIAL/PLATELET
Basophils Absolute: 0.1 10*3/uL (ref 0.0–0.2)
Basos: 1 %
EOS (ABSOLUTE): 0.2 10*3/uL (ref 0.0–0.4)
Eos: 3 %
Hematocrit: 47.1 % (ref 37.5–51.0)
Hemoglobin: 16 g/dL (ref 13.0–17.7)
Immature Grans (Abs): 0 10*3/uL (ref 0.0–0.1)
Immature Granulocytes: 0 %
Lymphocytes Absolute: 2 10*3/uL (ref 0.7–3.1)
Lymphs: 27 %
MCH: 28.8 pg (ref 26.6–33.0)
MCHC: 34 g/dL (ref 31.5–35.7)
MCV: 85 fL (ref 79–97)
Monocytes Absolute: 0.5 10*3/uL (ref 0.1–0.9)
Monocytes: 7 %
Neutrophils Absolute: 4.5 10*3/uL (ref 1.4–7.0)
Neutrophils: 62 %
Platelets: 201 10*3/uL (ref 150–450)
RBC: 5.55 x10E6/uL (ref 4.14–5.80)
RDW: 13.7 % (ref 11.6–15.4)
WBC: 7.2 10*3/uL (ref 3.4–10.8)

## 2022-10-01 LAB — LIPID PANEL
Chol/HDL Ratio: 3.2 ratio (ref 0.0–5.0)
Cholesterol, Total: 140 mg/dL (ref 100–199)
HDL: 44 mg/dL (ref >39)
LDL Chol Calc (NIH): 80 mg/dL (ref 0–99)
Triglycerides: 84 mg/dL (ref 0–149)
VLDL Cholesterol Cal: 16 mg/dL (ref 5–40)

## 2022-10-01 LAB — HEMOGLOBIN A1C
Est. average glucose Bld gHb Est-mCnc: 120 mg/dL
Hgb A1c MFr Bld: 5.8 % — ABNORMAL HIGH (ref 4.8–5.6)

## 2022-10-01 LAB — COMPREHENSIVE METABOLIC PANEL
ALT: 19 IU/L (ref 0–44)
AST: 17 IU/L (ref 0–40)
Albumin/Globulin Ratio: 1.8 (ref 1.2–2.2)
Albumin: 4.6 g/dL (ref 3.9–4.9)
Alkaline Phosphatase: 94 IU/L (ref 44–121)
BUN/Creatinine Ratio: 16 (ref 10–24)
BUN: 15 mg/dL (ref 8–27)
Bilirubin Total: 0.5 mg/dL (ref 0.0–1.2)
CO2: 21 mmol/L (ref 20–29)
Calcium: 9.4 mg/dL (ref 8.6–10.2)
Chloride: 103 mmol/L (ref 96–106)
Creatinine, Ser: 0.91 mg/dL (ref 0.76–1.27)
Globulin, Total: 2.5 g/dL (ref 1.5–4.5)
Glucose: 98 mg/dL (ref 70–99)
Potassium: 4.2 mmol/L (ref 3.5–5.2)
Sodium: 142 mmol/L (ref 134–144)
Total Protein: 7.1 g/dL (ref 6.0–8.5)
eGFR: 95 mL/min/{1.73_m2} (ref >59)

## 2022-10-03 ENCOUNTER — Encounter: Payer: Self-pay | Admitting: Family Medicine

## 2022-10-14 ENCOUNTER — Telehealth: Payer: Self-pay

## 2022-10-14 DIAGNOSIS — U071 COVID-19: Secondary | ICD-10-CM

## 2022-10-14 MED ORDER — NIRMATRELVIR/RITONAVIR (PAXLOVID)TABLET: 3.0000 | ORAL_TABLET | Freq: Two times a day (BID) | ORAL | 0 refills | Status: AC

## 2022-10-14 NOTE — Addendum Note (Signed)
Addended by: Virgil Benedict on: 10/14/2022 11:09 AM   Modules accepted: Orders

## 2022-10-14 NOTE — Telephone Encounter (Signed)
Symptoms started less than 5 days ago, reasonable to start course of Paxlovid.  Sent that into the pharmacy for them to pick up.

## 2022-10-14 NOTE — Telephone Encounter (Signed)
Patient states symptoms started 2 days ago with a scratchy throat and then progressed to worse over night.

## 2022-10-14 NOTE — Telephone Encounter (Signed)
Pt wife is calling stating that the Pt is COVID POS. Tested last night about 830pm.  Pt is achy, cold, congested, fatigue, unable to sleep due to congestion.  They are requesting antiviral or suggestions on what to do.   Please call [phone]

## 2022-10-14 NOTE — Telephone Encounter (Signed)
See when his symptoms initially started.  If more than 5 days ago, then recommend supportive care with over-the-counter medications like ZzzQuil/NyQuil, lots of fluids.  If symptoms started less than 5 days ago, then we can do Paxlovid.  Just let me know.

## 2022-10-27 LAB — T3, FREE: T3, Free: 4 pg/mL (ref 2.0–4.4)

## 2022-10-27 LAB — T4, FREE: Free T4: 1.71 ng/dL (ref 0.82–1.77)

## 2022-10-27 LAB — TSH: TSH: 0.033 u[IU]/mL — ABNORMAL LOW (ref 0.450–4.500)

## 2022-11-01 ENCOUNTER — Encounter: Payer: Self-pay | Admitting: Nurse Practitioner

## 2022-11-01 ENCOUNTER — Ambulatory Visit (INDEPENDENT_AMBULATORY_CARE_PROVIDER_SITE_OTHER): Payer: Medicare Other | Admitting: Nurse Practitioner

## 2022-11-01 VITALS — BP 130/80 | HR 78 | Ht 68.0 in | Wt 244.4 lb

## 2022-11-01 DIAGNOSIS — E0590 Thyrotoxicosis, unspecified without thyrotoxic crisis or storm: Secondary | ICD-10-CM | POA: Diagnosis not present

## 2022-11-01 NOTE — Progress Notes (Signed)
11/01/2022     Endocrinology Follow Up Note    Subjective:    Patient ID: James Andersen, male    DOB: 10/26/1960, PCP Melida QuitterEdstrom, Morgan A, PA.   Past Medical History:  Diagnosis Date   High cholesterol    Hypertension    Meniere's disease    Thyroid disease     Past Surgical History:  Procedure Laterality Date   HERNIA REPAIR     LEFT HEART CATH AND CORONARY ANGIOGRAPHY N/A 01/10/2022   Procedure: LEFT HEART CATH AND CORONARY ANGIOGRAPHY;  Surgeon: Iran OuchArida, Muhammad A, MD;  Location: [facility];  Service: Cardiovascular;  Laterality: N/A;   VASECTOMY      Social History   Socioeconomic History   Marital status: Married    Spouse name: Verlon AuLeslie   Number of children: 1   Years of education: Not on file   Highest education level: Not on file  Occupational History   Not on file  Tobacco Use   Smoking status: Former    Packs/day: 1.00    Years: 30.00    Additional pack years: 0.00    Total pack years: 30.00    Types: Cigarettes    Quit date: 02/22/2017    Years since quitting: 5.6    Passive exposure: Never   Smokeless tobacco: Never  Vaping Use   Vaping Use: Never used  Substance and Sexual Activity   Alcohol use: Not Currently   Drug use: Yes    Types: Marijuana   Sexual activity: Not Currently  Other Topics Concern   Not on file  Social History Narrative   Not on file   Social Determinants of Health   Financial Resource Strain: Medium Risk (02/02/2022)   Overall Financial Resource Strain (CARDIA)    Difficulty of Paying Living Expenses: Somewhat hard  Food Insecurity: No Food Insecurity (02/02/2022)   Hunger Vital Sign    Worried About Running Out of Food in the Last Year: Never true    Ran Out of Food in the Last Year: Never true  Transportation Needs: No Transportation Needs (02/02/2022)   PRAPARE - Administrator, Civil ServiceTransportation    Lack of Transportation (Medical): No    Lack of Transportation (Non-Medical): No  Physical Activity: Sufficiently  Active (02/02/2022)   Exercise Vital Sign    Days of Exercise per Week: 7 days    Minutes of Exercise per Session: 60 min  Stress: Stress Concern Present (02/02/2022)   [employer] - Occupational Stress Questionnaire    Feeling of Stress : To some extent  Social Connections: Moderately Isolated (02/02/2022)   Social Connection and Isolation Panel [NHANES]    Frequency of Communication with Friends and Family: Three times a week    Frequency of Social Gatherings with Friends and Family: More than three times a week    Attends Religious Services: Never    Database administratorActive Member of Clubs or Organizations: No    Attends BankerClub or Organization Meetings: Never    Marital Status: Married    Family History  Problem Relation Age of Onset   Cancer Mother    Cancer Father     Outpatient Encounter Medications as of 11/01/2022  Medication Sig   albuterol (VENTOLIN HFA) 108 (90 Base) MCG/ACT inhaler Inhale 1 puff into the lungs every 6 (six) hours as needed for wheezing or shortness of breath.   ALPRAZolam (XANAX) 1 MG tablet Follow tapering instructions included in your after visit summary.   amLODipine (  NORVASC) 5 MG tablet Take 5 mg by mouth in the morning and at bedtime.   aspirin EC 81 MG tablet Take 81 mg by mouth daily. Swallow whole.   lisinopril (ZESTRIL) 40 MG tablet Take 40 mg by mouth daily.   metoprolol tartrate (LOPRESSOR) 50 MG tablet Take 0.5 tablets (25 mg total) by mouth 2 (two) times daily.   Betahistine HCl POWD Take 8 mg by mouth 3 (three) times daily. Can increase to 16 mg by mouth 3 times daily as tolerated. (Patient not taking: Reported on 11/01/2022)   ezetimibe (ZETIA) 10 MG tablet Take 1 tablet (10 mg total) by mouth daily. (Patient not taking: Reported on 11/01/2022)   No facility-administered encounter medications on file as of 11/01/2022.    ALLERGIES: No Known Allergies  VACCINATION STATUS:  There is no immunization history on file for this  patient.   HPI  James Andersen is 62 y.o. male who presents today with a medical history as above. he is being seen in follow up after being seen in consultation for hyperthyroidism requested by Melida Quitter, PA.  he has been dealing with symptoms of tremors, unintentional weight loss, diarrhea, insomnia, anxiety, and SOB for 4  months. These symptoms are progressively worsening and troubling to him.  his most recent thyroid labs revealed suppressed TSH of < 0.005 and elevated FT4 of 2.16 on 11/22/21 where he was started on Methimazole and now titrated up to 10 mg po BID.  He had a thyroid ultrasound performed in March 2023 which was normal.  Since that time, he has been slowly weaning of his Methimazole, currently on 5 mg po daily.  he denies dysphagia, choking, shortness of breath, no recent voice change.    he denies known family history of thyroid dysfunction and denies family hx of thyroid cancer. he denies personal history of goiter. Denies use of Biotin containing supplements.  he is willing to proceed with appropriate work up and therapy for thyrotoxicosis.   Review of systems  Constitutional: + slowly decreasing body weight,  current Body mass index is 37.16 kg/m. , + fatigue, no subjective hyperthermia, no subjective hypothermia, + insomnia Eyes: no blurry vision, no xerophthalmia ENT: no sore throat, no nodules palpated in throat, no dysphagia/odynophagia, no hoarseness Cardiovascular: no chest pain, no shortness of breath, + mild intermittent palpitations, no leg swelling Respiratory: no cough, no shortness of breath Gastrointestinal: no nausea/vomiting/diarrhea, + intermittent constipation Musculoskeletal: + diffuse muscle/joint aches Skin: no rashes, no hyperemia Neurological: no tremors, no numbness, no tingling, no dizziness Psychiatric: no depression, no anxiety   Objective:    BP 130/80 (BP Location: Right Arm, Patient Position: Sitting, Cuff Size: Large)    Pulse 78   Ht 5\' 8"  (1.727 m)   Wt 244 lb 6.4 oz (110.9 kg)   BMI 37.16 kg/m   Wt Readings from Last 3 Encounters:  11/01/22 244 lb 6.4 oz (110.9 kg)  09/29/22 247 lb (112 kg)  08/10/22 248 lb 9.6 oz (112.8 kg)     BP Readings from Last 3 Encounters:  11/01/22 130/80  09/29/22 (!) 144/83  08/10/22 134/84                         Physical Exam- Limited  Constitutional:  Body mass index is 37.16 kg/m. , not in acute distress, normal state of mind Eyes:  EOMI, no exophthalmos Musculoskeletal: no gross deformities, strength intact in all four extremities, no gross restriction  of joint movements Skin:  no rashes, no hyperemia Neurological: no tremor with outstretched hands   CMP     Component Value Date/Time   NA 142 09/29/2022 0918   K 4.2 09/29/2022 0918   CL 103 09/29/2022 0918   CO2 21 09/29/2022 0918   GLUCOSE 98 09/29/2022 0918   GLUCOSE 128 (H) 01/03/2022 0936   BUN 15 09/29/2022 0918   CREATININE 0.91 09/29/2022 0918   CALCIUM 9.4 09/29/2022 0918   PROT 7.1 09/29/2022 0918   ALBUMIN 4.6 09/29/2022 0918   AST 17 09/29/2022 0918   ALT 19 09/29/2022 0918   ALKPHOS 94 09/29/2022 0918   BILITOT 0.5 09/29/2022 0918   GFRNONAA >60 01/03/2022 0936     CBC    Component Value Date/Time   WBC 7.2 09/29/2022 0918   WBC 8.5 01/03/2022 0936   RBC 5.55 09/29/2022 0918   RBC 5.66 01/03/2022 0936   HGB 16.0 09/29/2022 0918   HCT 47.1 09/29/2022 0918   PLT 201 09/29/2022 0918   MCV 85 09/29/2022 0918   MCH 28.8 09/29/2022 0918   MCH 26.1 01/03/2022 0936   MCHC 34.0 09/29/2022 0918   MCHC 33.0 01/03/2022 0936   RDW 13.7 09/29/2022 0918   LYMPHSABS 2.0 09/29/2022 0918   EOSABS 0.2 09/29/2022 0918   BASOSABS 0.1 09/29/2022 0918     Diabetic Labs (most recent): Lab Results  Component Value Date   HGBA1C 5.8 (H) 09/29/2022   HGBA1C 5.7 (H) 10/17/2021    Lipid Panel     Component Value Date/Time   CHOL 140 09/29/2022 0918   TRIG 84 09/29/2022 0918   HDL  44 09/29/2022 0918   CHOLHDL 3.2 09/29/2022 0918   CHOLHDL 3.6 10/17/2021 0321   VLDL 15 10/17/2021 0321   LDLCALC 80 09/29/2022 0918   LABVLDL 16 09/29/2022 0918     Lab Results  Component Value Date   TSH 0.033 (L) 10/26/2022   TSH 2.910 08/04/2022   TSH 20.100 (H) 06/20/2022   TSH <0.005 (L) 03/17/2022   TSH <0.005 (L) 02/25/2022   TSH 1.410 01/20/2022   TSH <0.005 (L) 11/22/2021   TSH <0.010 (L) 10/16/2021   FREET4 1.71 10/26/2022   FREET4 0.94 08/04/2022   FREET4 0.65 (L) 06/20/2022   FREET4 1.26 03/17/2022   FREET4 3.68 (H) 02/25/2022   FREET4 0.72 (L) 01/20/2022   FREET4 2.16 (H) 11/22/2021   FREET4 4.79 (H) 10/16/2021     Uptake and scan from 08/23/22 CLINICAL DATA:  Hyperthyroidism, E05.90   EXAM: THYROID SCAN AND UPTAKE - 4 AND 24 HOURS   TECHNIQUE: Following oral administration of I-123 capsule, anterior planar imaging was acquired at 24 hours. Thyroid uptake was calculated with a thyroid probe at 4-6 hours and 24 hours.   RADIOPHARMACEUTICALS:  438.7 uCi I-123 sodium iodide p.o.   COMPARISON:  Thyroid ultrasound 10/16/2021   FINDINGS: Homogeneous tracer distribution in both thyroid lobes.   Faint pyramidal lobe.   No focal areas of increased or decreased tracer localization.   4 hour I-123 uptake = 15.1% (normal 5-20%)   24 hour I-123 uptake = 41.5% (normal 10-30%)   IMPRESSION: Elevated 24 hour radio iodine uptake of 41.5% with homogeneous tracer distribution in both thyroid lobes.   Findings consistent with Graves disease.     Electronically Signed   By: Ulyses Southward M.D.   On: 08/23/2022 10:39    Latest Reference Range & Units 02/25/22 09:32 03/17/22 10:58 06/20/22 10:11 08/04/22 10:21 10/26/22 09:42  TSH 0.450 - 4.500 uIU/mL <0.005 (L) <0.005 (L) 20.100 (H) 2.910 0.033 (L)  Triiodothyronine,Free,Serum 2.0 - 4.4 pg/mL 12.2 (H) 3.6 2.5 2.9 4.0  T4,Free(Direct) 0.82 - 1.77 ng/dL 6.28 (H) 3.66 2.94 (L) 0.94 1.71  (L): Data is abnormally  low (H): Data is abnormally high   Assessment & Plan:   1. Hyperthyroidism- r/t Graves disease  he is being seen at a kind request of Melida Quitter, [place].  His thyroid antibodies were positive, indicating autoimmune thyroid dysfunction.  His uptake and scan was high consistent with Graves disease.  We discussed and decided with RAI ablation as treatment for this.  He had his RAI on 09/16/22.  His repeat thyroid function is borderline high, TSH suppressed and FT4 on upper end of normal.  He is not ready for thyroid hormone replacement therapy at this time.  Will recheck labs in 5 weeks to assess response to therapy and determine appropriate timing of thyroid hormone initiation.    -Patient is advised to maintain close follow up with Melida Quitter, PA for primary care needs.    I spent  31  minutes in the care of the patient today including review of labs from Thyroid Function, CMP, and other relevant labs ; imaging/biopsy records (current and previous including abstractions from other facilities); face-to-face time discussing  his lab results and symptoms, medications doses, his options of short and long term treatment based on the latest standards of care / guidelines;   and documenting the encounter.  James Andersen  participated in the discussions, expressed understanding, and voiced agreement with the above plans.  All questions were answered to his satisfaction. he is encouraged to contact clinic should he have any questions or concerns prior to his return visit.  Follow up plan: Return in about 6 weeks (around 12/13/2022) for Thyroid follow up, Previsit labs.   Thank you for involving me in the care of this pleasant patient, and I will continue to update you with his progress.   Ronny Bacon, [facility] [address] [place], [place] [address] Phone: [phone] Fax: [phone]  11/01/2022, 2:05 PM

## 2022-11-10 ENCOUNTER — Encounter: Payer: Self-pay | Admitting: Family Medicine

## 2022-11-10 ENCOUNTER — Ambulatory Visit (INDEPENDENT_AMBULATORY_CARE_PROVIDER_SITE_OTHER): Payer: Medicare Other | Admitting: Family Medicine

## 2022-11-10 VITALS — BP 145/85 | HR 53 | Resp 18 | Ht 68.0 in | Wt 247.0 lb

## 2022-11-10 DIAGNOSIS — G4700 Insomnia, unspecified: Secondary | ICD-10-CM

## 2022-11-10 DIAGNOSIS — H8101 Meniere's disease, right ear: Secondary | ICD-10-CM | POA: Diagnosis not present

## 2022-11-10 DIAGNOSIS — E785 Hyperlipidemia, unspecified: Secondary | ICD-10-CM

## 2022-11-10 DIAGNOSIS — I10 Essential (primary) hypertension: Secondary | ICD-10-CM | POA: Diagnosis not present

## 2022-11-10 MED ORDER — TRIAMTERENE-HCTZ 37.5-25 MG PO TABS
1.0000 | ORAL_TABLET | Freq: Every day | ORAL | 1 refills | Status: DC
Start: 2022-11-10 — End: 2023-02-16

## 2022-11-10 NOTE — Assessment & Plan Note (Signed)
Blood pressure above goal of less than 130/80.  We discussed medication options and patient would prefer to take 1 medication that could help with both his hypertension and Mnire's disease.  Continue lisinopril 40 mg daily.  Start triamterene-HCTZ 37.5-20 mg daily.  This is a better option than furosemide due to a history of hypokalemia while on a diuretic in [place].  We will collect CMP and monitor potassium levels while taking this medication.  Hopefully will be on this regimen for about 6 months and then may switch to another routine once Mnire's disease has been alleviated.

## 2022-11-10 NOTE — Assessment & Plan Note (Signed)
Patient hopes that insomnia will improve after Mnire's disease symptoms are better controlled and thyroid levels even out.  Possible to use something like betahistine in the future for Mnire's disease and sleep, or if continuing triamterene-HCTZ, then may consider something like trazodone for sleep.  Continue to taper off of Xanax.  Patient verbalized understanding and is agreeable to this plan.

## 2022-11-10 NOTE — Patient Instructions (Signed)
STOP taking rosuvastatin (Crestor). START taking ezetimibe (Zetia) for your cholesterol instead.  START taking hydrochlorothiazide-triamterene which should help with your blood pressure and your ear.

## 2022-11-10 NOTE — Progress Notes (Signed)
Established Patient Office Visit  Subjective   Patient ID: James Andersen, male    DOB: [birth year]  Age: 62 y.o. MRN: [id number]  Chief Complaint  Patient presents with   Hypertension    HPI James Andersen is a 62 y.o. male presenting today with his wife for follow up of hypertension, hyperlipidemia, insomnia, Mnire's disease.  He is still following up with endocrinology for his thyroid and has been noted to have "hit the wall".  Patient is having a lot of symptoms including irritability, low mood, anxiety, insomnia, increased irritation of his ears. Hypertension: Patient here for follow-up of elevated blood pressure.  Pt denies chest pain, dizziness, edema, syncope, fatigue or heart palpitations. Taking lisinopril 40 mg daily, reports excellent compliance with treatment. Denies side effects.  He does endorse some increase in appetite and shortness of breath that his endocrinologist told him to expect secondary to his thyroid. Hyperlipidemia: Previously taking rosuvastatin but developed myalgias.  At last appointment, discussed discontinuing rosuvastatin and switching to Zetia instead, but there was some confusion and he was taking both.  He decided to stop [place] after still having myalgias particularly in his back. The 10-year ASCVD risk score (Arnett DK, et al., 2019) is: 12.9% Insomnia: He did taper off of the Xanax up to the point that he has been taking 1/4 tablet every other night to help him get to sleep.  He is hopeful that once he gets through the worst of his thyroid related symptoms and better control of his Mnire's disease that it will be easier to sleep. Mnire's disease: Recommended to start betahistine at last appointment.  Patient's wife states that they were unable to pick up this medication.  ROS Negative unless otherwise noted in HPI   Objective:     BP (!) 145/85 (BP Location: Right Arm)   Pulse (!) 53   Resp 18   Ht  (1.727 m)   Wt 247 lb (112  kg)   SpO2 97%   BMI 37.56 kg/m   Physical Exam Constitutional:      General: He is not in acute distress.    Appearance: Normal appearance.  HENT:     Head: Normocephalic and atraumatic.  Cardiovascular:     Rate and Rhythm: Normal rate and regular rhythm.     Heart sounds: Normal heart sounds. No murmur heard.    No friction rub. No gallop.  Pulmonary:     Effort: Pulmonary effort is normal.     Breath sounds: Normal breath sounds. No wheezing, rhonchi or rales.  Skin:    General: Skin is warm and dry.  Neurological:     Mental Status: He is alert and oriented to person, place, and time.  Psychiatric:        Mood and Affect: Mood normal.     Assessment & Plan:  Essential hypertension Assessment & Plan: Blood pressure above goal of less than 130/80.  We discussed medication options and patient would prefer to take 1 medication that could help with both his hypertension and Mnire's disease.  Continue lisinopril 40 mg daily.  Start triamterene-HCTZ 37.5-20 mg daily.  This is a better option than furosemide due to a history of hypokalemia while on a diuretic in [place].  We will collect CMP and monitor potassium levels while taking this medication.  Hopefully will be on this regimen for about 6 months and then may switch to another routine once Mnire's disease has been alleviated.  Orders: -  Triamterene-HCTZ; Take 1 tablet by mouth daily.  Dispense: 90 tablet; Refill: 1  Meniere disease, right Assessment & Plan: Patient unable to pick up betahistine.  It still could be an option to do beta histamine 8 mg 3 times a day up to 16 mg 3 times a day as tolerated.  For now, opted for medication that will treat hypertension and Mnire's disease.  Start triamterene-HCTZ 37.5-25 mg daily.  Will repeat CMP for medication monitoring at follow-up in 6 weeks.  Orders: -     Triamterene-HCTZ; Take 1 tablet by mouth daily.  Dispense: 90 tablet; Refill: 1  Insomnia, unspecified  type Assessment & Plan: Patient hopes that insomnia will improve after Mnire's disease symptoms are better controlled and thyroid levels even out.  Possible to use something like betahistine in the future for Mnire's disease and sleep, or if continuing triamterene-HCTZ, then may consider something like trazodone for sleep.  Continue to taper off of Xanax.  Patient verbalized understanding and is agreeable to this plan.   Hyperlipidemia, unspecified hyperlipidemia type Assessment & Plan: Last lipid panel: LDL 80, HDL 44, triglycerides 84.  Patient unable to tolerate myalgias due to rosuvastatin.  Discontinue rosuvastatin, switch to ezetimibe 10 mg daily.  Will repeat lipid panel at follow-up visit in 6 weeks to check for efficacy of Zetia and adjust medication as indicated.  Encouraged to continue heart healthy diet low in fat.  Will continue to monitor.     Return in about 6 weeks (around 12/22/2022) for follow-up for hypertension, Mnire's disease, lipidemia, and insomnia; fasting blood work.    Melida Quitter, PA

## 2022-11-10 NOTE — Assessment & Plan Note (Signed)
Last lipid panel: LDL 80, HDL 44, triglycerides 84.  Patient unable to tolerate myalgias due to rosuvastatin.  Discontinue rosuvastatin, switch to ezetimibe 10 mg daily.  Will repeat lipid panel at follow-up visit in 6 weeks to check for efficacy of Zetia and adjust medication as indicated.  Encouraged to continue heart healthy diet low in fat.  Will continue to monitor.

## 2022-11-10 NOTE — Assessment & Plan Note (Signed)
Patient unable to pick up betahistine.  It still could be an option to do beta histamine 8 mg 3 times a day up to 16 mg 3 times a day as tolerated.  For now, opted for medication that will treat hypertension and Mnire's disease.  Start triamterene-HCTZ 37.5-25 mg daily.  Will repeat CMP for medication monitoring at follow-up in 6 weeks.

## 2022-11-15 ENCOUNTER — Other Ambulatory Visit: Payer: Self-pay | Admitting: Family Medicine

## 2022-12-20 ENCOUNTER — Other Ambulatory Visit: Payer: Self-pay | Admitting: Family Medicine

## 2022-12-20 DIAGNOSIS — G4700 Insomnia, unspecified: Secondary | ICD-10-CM

## 2022-12-20 DIAGNOSIS — H8101 Meniere's disease, right ear: Secondary | ICD-10-CM

## 2022-12-21 ENCOUNTER — Ambulatory Visit: Payer: Medicare Other | Admitting: Nurse Practitioner

## 2022-12-23 ENCOUNTER — Telehealth: Payer: Self-pay | Admitting: *Deleted

## 2022-12-23 DIAGNOSIS — H8101 Meniere's disease, right ear: Secondary | ICD-10-CM

## 2022-12-23 DIAGNOSIS — G4700 Insomnia, unspecified: Secondary | ICD-10-CM

## 2022-12-23 MED ORDER — ALPRAZOLAM 1 MG PO TABS: ORAL_TABLET | ORAL | 0 refills | Status: DC

## 2022-12-23 NOTE — Telephone Encounter (Signed)
Pts wife calling stating that pts alprazolam was denied.  She wanted to remind the provider that he was going to stay on his medications while he is having his thyroid killed off.  She would like a refill for this.  She said that pt only has a few pills and she doesn't want him to run out. PLease advise.

## 2022-12-23 NOTE — Addendum Note (Signed)
Addended by: Saralyn Pilar on: 12/23/2022 10:24 AM   Modules accepted: Orders

## 2022-12-23 NOTE — Telephone Encounter (Signed)
Refill sent for 10 tablets, this should be more than enough to get him through her next appointment as at his last appointment he endorsed taking 1/4 tablet about every other night.  I hope that meeting with the endocrinologist will be helpful. Meds ordered this encounter  Medications   ALPRAZolam (XANAX) 1 MG tablet    Sig: Take 1/2 to 1 tablet (0.5 to 1 mg) nightly at bedtime.    Dispense:  10 tablet    Refill:  0    Provided instructions to taper off at his visit.    Order Specific Question:   Supervising Provider    Answer:   Nani Gasser D [[id number]]

## 2022-12-23 NOTE — Telephone Encounter (Signed)
Does he have enough to get through his upcoming appointment in a couple of weeks?  We were going to reevaluate how things are going at that time.  If you do not have enough to get to that appointment, then I would be happy to send in a few pills to get you through the next couple of weeks.

## 2022-12-23 NOTE — Telephone Encounter (Signed)
Pt states he only has like 2 left, he said that he having a hard time with this process and that he would appreciate you sending it in, it will help him sleep.  He has reached out to endo due to the struggle and SOB.  He is waiting for them to call him back.     ALPRAZolam Prudy Feeler) 1 MG tablet   [facility] [address] [facility] - [place], [place] - [address]    Theron Arista 11/10/22 ROV 01/05/23

## 2022-12-26 ENCOUNTER — Telehealth: Payer: Self-pay | Admitting: *Deleted

## 2022-12-26 ENCOUNTER — Ambulatory Visit: Payer: Medicare Other | Admitting: Family Medicine

## 2022-12-26 NOTE — Telephone Encounter (Signed)
Patient had left a message for [place] to call him on Friday,his call was returned and a message was left for him to call our office back.

## 2022-12-26 NOTE — Telephone Encounter (Signed)
When and where did he have the labs done?  I do not see them in his chart?  Last labs I see is from April.

## 2022-12-26 NOTE — Telephone Encounter (Signed)
Talked with Cayne. He shares that he has been having screaming headaches , Can't breathe good, legs tingle, fatigued . No  changes in any medications He is going tomorrow at 9 am to have his lab work drawn, he will keep hi appointment to see you on, Monday June 10. He is asking if any medications need to be changed , please do it, he can't stand what he has been going through.

## 2022-12-26 NOTE — Telephone Encounter (Signed)
Patient left a message that he had had a bad weekend. He has lab work done. He would like for you to take a look at it to see if he can start taking a pill or do something else for his blood sugar. He states in his message that he has appointment with you on Monday June 10, but if he can go ahead and start something he wants to , doesn't want to go through another weekend like the past one.  Please advise.

## 2022-12-27 NOTE — Telephone Encounter (Signed)
I cannot change any medications until I see what is going on with his labs first, for safety purposes.  Once those come back, I can advise if a dosage change is warranted.

## 2022-12-27 NOTE — Telephone Encounter (Signed)
Noted, once the lab results are back , changes may be addressed.

## 2022-12-28 ENCOUNTER — Encounter: Payer: Self-pay | Admitting: Nurse Practitioner

## 2022-12-28 ENCOUNTER — Telehealth: Payer: Self-pay | Admitting: *Deleted

## 2022-12-28 MED ORDER — LEVOTHYROXINE SODIUM 50 MCG PO TABS: 50.0000 ug | ORAL_TABLET | Freq: Every day | ORAL | 0 refills | Status: DC

## 2022-12-28 MED ORDER — LEVOTHYROXINE SODIUM 50 MCG PO TABS: 50.0000 ug | ORAL_TABLET | Freq: Every day | ORAL | 3 refills | Status: DC

## 2022-12-28 NOTE — Telephone Encounter (Signed)
Noted, that James Andersen has talked to the patient.

## 2022-12-28 NOTE — Addendum Note (Signed)
Addended by: Dani Gobble on: 12/28/2022 07:40 AM   Modules accepted: Orders

## 2022-12-28 NOTE — Telephone Encounter (Signed)
-----   Message from Dani Gobble, NP sent at 12/28/2022  7:43 AM EDT ----- FYI: I sent patient message going over thyroid labs and started him on thyroid hormone.

## 2022-12-28 NOTE — Telephone Encounter (Signed)
Confirmed pt did get this rx

## 2022-12-28 NOTE — Telephone Encounter (Signed)
-----   Message from [name] J Reardon, NP sent at 12/28/2022  7:43 AM EDT ----- FYI: I sent patient message going over thyroid labs and started him on thyroid hormone. 

## 2022-12-28 NOTE — Progress Notes (Signed)
FYI: I sent patient message going over thyroid labs and started him on thyroid hormone.

## 2022-12-30 NOTE — Patient Instructions (Incomplete)

## 2023-01-02 ENCOUNTER — Ambulatory Visit (INDEPENDENT_AMBULATORY_CARE_PROVIDER_SITE_OTHER): Payer: Medicare Other | Admitting: Nurse Practitioner

## 2023-01-02 ENCOUNTER — Encounter: Payer: Self-pay | Admitting: Nurse Practitioner

## 2023-01-02 VITALS — BP 138/88 | HR 60 | Ht 68.0 in | Wt 260.6 lb

## 2023-01-02 DIAGNOSIS — E890 Postprocedural hypothyroidism: Secondary | ICD-10-CM | POA: Diagnosis not present

## 2023-01-02 NOTE — Progress Notes (Signed)
01/02/2023     Endocrinology Follow Up Note    Subjective:    Patient ID: James Andersen, male    DOB: Nov 12, 1960, PCP Melida Quitter, PA.   Past Medical History:  Diagnosis Date   High cholesterol    Hypertension    Meniere's disease    Thyroid disease     Past Surgical History:  Procedure Laterality Date   HERNIA REPAIR     LEFT HEART CATH AND CORONARY ANGIOGRAPHY N/A 01/10/2022   Procedure: LEFT HEART CATH AND CORONARY ANGIOGRAPHY;  Surgeon: Iran Ouch, MD;  Location: [facility];  Service: Cardiovascular;  Laterality: N/A;   VASECTOMY      Social History   Socioeconomic History   Marital status: Married    Spouse name: Verlon Au   Number of children: 1   Years of education: Not on file   Highest education level: Not on file  Occupational History   Not on file  Tobacco Use   Smoking status: Former    Packs/day: 1.00    Years: 30.00    Additional pack years: 0.00    Total pack years: 30.00    Types: Cigarettes    Quit date: 02/22/2017    Years since quitting: 5.8    Passive exposure: Never   Smokeless tobacco: Never  Vaping Use   Vaping Use: Never used  Substance and Sexual Activity   Alcohol use: Not Currently   Drug use: Yes    Types: Marijuana   Sexual activity: Not Currently  Other Topics Concern   Not on file  Social History Narrative   Not on file   Social Determinants of Health   Financial Resource Strain: Medium Risk (02/02/2022)   Overall Financial Resource Strain (CARDIA)    Difficulty of Paying Living Expenses: Somewhat hard  Food Insecurity: No Food Insecurity (02/02/2022)   Hunger Vital Sign    Worried About Running Out of Food in the Last Year: Never true    Ran Out of Food in the Last Year: Never true  Transportation Needs: No Transportation Needs (02/02/2022)   PRAPARE - Administrator, Civil Service (Medical): No    Lack of Transportation (Non-Medical): No  Physical Activity: Sufficiently  Active (02/02/2022)   Exercise Vital Sign    Days of Exercise per Week: 7 days    Minutes of Exercise per Session: 60 min  Stress: Stress Concern Present (02/02/2022)   [employer] of Occupational Health - Occupational Stress Questionnaire    Feeling of Stress : To some extent  Social Connections: Moderately Isolated (02/02/2022)   Social Connection and Isolation Panel [NHANES]    Frequency of Communication with Friends and Family: Three times a week    Frequency of Social Gatherings with Friends and Family: More than three times a week    Attends Religious Services: Never    Database administrator or Organizations: No    Attends Banker Meetings: Never    Marital Status: Married    Family History  Problem Relation Age of Onset   Cancer Mother    Cancer Father     Outpatient Encounter Medications as of 01/02/2023  Medication Sig   albuterol (VENTOLIN HFA) 108 (90 Base) MCG/ACT inhaler Inhale 1 puff into the lungs every 6 (six) hours as needed for wheezing or shortness of breath.   ALPRAZolam (XANAX) 1 MG tablet Take 1/2 to 1 tablet (0.5 to 1 mg) nightly at bedtime.  amLODipine (NORVASC) 5 MG tablet TAKE ONE TABLET BY MOUTH TWICE A DAY FOR BLOOD PRESSURE   aspirin EC 81 MG tablet Take 81 mg by mouth daily. Swallow whole.   levothyroxine (SYNTHROID) 50 MCG tablet Take 1 tablet (50 mcg total) by mouth daily before breakfast.   lisinopril (ZESTRIL) 40 MG tablet Take 40 mg by mouth daily.   metoprolol tartrate (LOPRESSOR) 50 MG tablet Take 0.5 tablets (25 mg total) by mouth 2 (two) times daily.   triamterene-hydrochlorothiazide (MAXZIDE-25) 37.5-25 MG tablet Take 1 tablet by mouth daily.   ezetimibe (ZETIA) 10 MG tablet Take 1 tablet (10 mg total) by mouth daily. (Patient not taking: Reported on 11/01/2022)   No facility-administered encounter medications on file as of 01/02/2023.    ALLERGIES: No Known Allergies  VACCINATION STATUS:  There is no immunization  history on file for this patient.   HPI  James Andersen is 62 y.o. male who presents today with a medical history as above. he is being seen in follow up after being seen in consultation for hyperthyroidism requested by Melida Quitter, PA.  he has been dealing with symptoms of tremors, unintentional weight loss, diarrhea, insomnia, anxiety, and SOB for 4  months. These symptoms are progressively worsening and troubling to him.  his most recent thyroid labs revealed suppressed TSH of < 0.005 and elevated FT4 of 2.16 on 11/22/21 where he was started on Methimazole and now titrated up to 10 mg po BID.  He had a thyroid ultrasound performed in March 2023 which was normal.  Since that time, he has been slowly weaning of his Methimazole, currently on 5 mg po daily.  he denies dysphagia, choking, shortness of breath, no recent voice change.    he denies known family history of thyroid dysfunction and denies family hx of thyroid cancer. he denies personal history of goiter. Denies use of Biotin containing supplements.  he is willing to proceed with appropriate work up and therapy for thyrotoxicosis.   He is s/p RAI ablation for Graves disease on 09/16/22.  Review of systems  Constitutional: + increasing body weight,  current Body mass index is 39.62 kg/m. , + fatigue, no subjective hyperthermia, no subjective hypothermia, + insomnia, + headaches Eyes: no blurry vision, no xerophthalmia ENT: no sore throat, no nodules palpated in throat, no dysphagia/odynophagia, no hoarseness, + nasal congestion (says his allergies are acting up) Cardiovascular: no chest pain, no shortness of breath, + mild intermittent palpitations, no leg swelling Respiratory: no cough, no shortness of breath Gastrointestinal: no nausea/vomiting/diarrhea, + intermittent constipation Musculoskeletal: + diffuse muscle/joint aches Skin: no rashes, no hyperemia Neurological: no tremors, no numbness, no tingling, no  dizziness Psychiatric: no depression, no anxiety   Objective:    BP 138/88 (BP Location: Right Arm, Patient Position: Sitting, Cuff Size: Large)   Pulse 60   Ht 5\' 8"  (1.727 m)   Wt 260 lb 9.6 oz (118.2 kg)   BMI 39.62 kg/m   Wt Readings from Last 3 Encounters:  01/02/23 260 lb 9.6 oz (118.2 kg)  11/10/22 247 lb (112 kg)  11/01/22 244 lb 6.4 oz (110.9 kg)     BP Readings from Last 3 Encounters:  01/02/23 138/88  11/10/22 (!) 145/85  11/01/22 130/80                       Physical Exam- Limited  Constitutional:  Body mass index is 39.62 kg/m. , not in acute distress, normal state of  mind Eyes:  EOMI, no exophthalmos Musculoskeletal: no gross deformities, strength intact in all four extremities, no gross restriction of joint movements Skin:  no rashes, no hyperemia Neurological: no tremor with outstretched hands   CMP     Component Value Date/Time   NA 142 09/29/2022 0918   K 4.2 09/29/2022 0918   CL 103 09/29/2022 0918   CO2 21 09/29/2022 0918   GLUCOSE 98 09/29/2022 0918   GLUCOSE 128 (H) 01/03/2022 0936   BUN 15 09/29/2022 0918   CREATININE 0.91 09/29/2022 0918   CALCIUM 9.4 09/29/2022 0918   PROT 7.1 09/29/2022 0918   ALBUMIN 4.6 09/29/2022 0918   AST 17 09/29/2022 0918   ALT 19 09/29/2022 0918   ALKPHOS 94 09/29/2022 0918   BILITOT 0.5 09/29/2022 0918   GFRNONAA >60 01/03/2022 0936     CBC    Component Value Date/Time   WBC 7.2 09/29/2022 0918   WBC 8.5 01/03/2022 0936   RBC 5.55 09/29/2022 0918   RBC 5.66 01/03/2022 0936   HGB 16.0 09/29/2022 0918   HCT 47.1 09/29/2022 0918   PLT 201 09/29/2022 0918   MCV 85 09/29/2022 0918   MCH 28.8 09/29/2022 0918   MCH 26.1 01/03/2022 0936   MCHC 34.0 09/29/2022 0918   MCHC 33.0 01/03/2022 0936   RDW 13.7 09/29/2022 0918   LYMPHSABS 2.0 09/29/2022 0918   EOSABS 0.2 09/29/2022 0918   BASOSABS 0.1 09/29/2022 0918     Diabetic Labs (most recent): Lab Results  Component Value Date   HGBA1C 5.8 (H)  09/29/2022   HGBA1C 5.7 (H) 10/17/2021    Lipid Panel     Component Value Date/Time   CHOL 140 09/29/2022 0918   TRIG 84 09/29/2022 0918   HDL 44 09/29/2022 0918   CHOLHDL 3.2 09/29/2022 0918   CHOLHDL 3.6 10/17/2021 0321   VLDL 15 10/17/2021 0321   LDLCALC 80 09/29/2022 0918   LABVLDL 16 09/29/2022 0918     Lab Results  Component Value Date   TSH 84.000 (H) 12/27/2022   TSH 0.033 (L) 10/26/2022   TSH 2.910 08/04/2022   TSH 20.100 (H) 06/20/2022   TSH <0.005 (L) 03/17/2022   TSH <0.005 (L) 02/25/2022   TSH 1.410 01/20/2022   TSH <0.005 (L) 11/22/2021   TSH <0.010 (L) 10/16/2021   FREET4 WILL FOLLOW 12/27/2022   FREET4 1.71 10/26/2022   FREET4 0.94 08/04/2022   FREET4 0.65 (L) 06/20/2022   FREET4 1.26 03/17/2022   FREET4 3.68 (H) 02/25/2022   FREET4 0.72 (L) 01/20/2022   FREET4 2.16 (H) 11/22/2021   FREET4 4.79 (H) 10/16/2021     Uptake and scan from 08/23/22 CLINICAL DATA:  Hyperthyroidism, E05.90   EXAM: THYROID SCAN AND UPTAKE - 4 AND 24 HOURS   TECHNIQUE: Following oral administration of I-123 capsule, anterior planar imaging was acquired at 24 hours. Thyroid uptake was calculated with a thyroid probe at 4-6 hours and 24 hours.   RADIOPHARMACEUTICALS:  438.7 uCi I-123 sodium iodide p.o.   COMPARISON:  Thyroid ultrasound 10/16/2021   FINDINGS: Homogeneous tracer distribution in both thyroid lobes.   Faint pyramidal lobe.   No focal areas of increased or decreased tracer localization.   4 hour I-123 uptake = 15.1% (normal 5-20%)   24 hour I-123 uptake = 41.5% (normal 10-30%)   IMPRESSION: Elevated 24 hour radio iodine uptake of 41.5% with homogeneous tracer distribution in both thyroid lobes.   Findings consistent with Graves disease.     Electronically Signed  By: Ulyses Southward M.D.   On: 08/23/2022 10:39    Latest Reference Range & Units 03/17/22 10:58 06/20/22 10:11 08/04/22 10:21 10/26/22 09:42 12/27/22 09:22  TSH 0.450 - 4.500 uIU/mL  <0.005 (L) 20.100 (H) 2.910 0.033 (L) 84.000 (H)  Triiodothyronine,Free,Serum 2.0 - 4.4 pg/mL 3.6 2.5 2.9 4.0 0.4 (L)  T4,Free(Direct) 0.82 - 1.77 ng/dL 9.56 2.13 (L) 0.86 5.78 WILL FOLLOW (P)  (L): Data is abnormally low (H): Data is abnormally high (P): Preliminary   Assessment & Plan:   1. Hypothyroidism- s/p RAI ablation for Graves disease  he is being seen at a kind request of Melida Quitter, [place].  His thyroid antibodies were positive, indicating autoimmune thyroid dysfunction.  His uptake and scan was high consistent with Graves disease.  We discussed and decided with RAI ablation as treatment for this.  He had his RAI on 09/16/22.  His repeat thyroid function shows successful ablation, now ready to start thyroid hormone replacement.  He did call last week for overt symptoms of underactive thyroid and was started on Levothyroxine 50 mcg po daily before breakfast.    - The correct intake of thyroid hormone (Levothyroxine, Synthroid), is on empty stomach first thing in the morning, with water, separated by at least 30 minutes from breakfast and other medications,  and separated by more than 4 hours from calcium, iron, multivitamins, acid reflux medications (PPIs).  - This medication is a life-long medication and will be needed to correct thyroid hormone imbalances for the rest of your life.  The dose may change from time to time, based on thyroid blood work.  - It is extremely important to be consistent taking this medication, near the same time each morning.  -AVOID TAKING PRODUCTS CONTAINING BIOTIN (commonly found in Hair, Skin, Nails vitamins) AS IT INTERFERES WITH THE VALIDITY OF THYROID FUNCTION BLOOD TESTS.     -Patient is advised to maintain close follow up with Melida Quitter, PA for primary care needs.   I spent  32  minutes in the care of the patient today including review of labs from Thyroid Function, CMP, and other relevant labs ; imaging/biopsy records (current  and previous including abstractions from other facilities); face-to-face time discussing  his lab results and symptoms, medications doses, his options of short and long term treatment based on the latest standards of care / guidelines;   and documenting the encounter.  Mcarthur Baldo Ash Rayne  participated in the discussions, expressed understanding, and voiced agreement with the above plans.  All questions were answered to his satisfaction. he is encouraged to contact clinic should he have any questions or concerns prior to his return visit.  Follow up plan: Return in about 2 months (around 03/04/2023) for Thyroid follow up, Previsit labs.   Thank you for involving me in the care of this pleasant patient, and I will continue to update you with his progress.   Ronny Bacon, [facility] [address] [place], [place] [address] Phone: [phone] Fax: [phone]  01/02/2023, 3:54 PM

## 2023-01-03 ENCOUNTER — Telehealth: Payer: Self-pay | Admitting: *Deleted

## 2023-01-03 NOTE — Telephone Encounter (Signed)
Patient was called and made aware. 

## 2023-01-03 NOTE — Telephone Encounter (Signed)
I dont advise increasing it just yet.  It hasn't even been a week since we initiated it.  I know we discussed needing to increase it a bit more before the next visit but he needs to give it at least 3 weeks.  His breathing issue may not be related to his thyroid at all, which we also discussed.  I do recommend he follow up with his PCP or urgent care if it worsens.

## 2023-01-03 NOTE — Telephone Encounter (Signed)
Patient called - he is asking if he can increase the hormone medication by another 1/2 tablet. He states that his breathing is like it was with his thyroid at the beginning, it feels like his heart is slowing down and not keeping up with his heart rate. Another sleepless night last night. I call him and I did share with him that if this continues he may need to go to the ED for further evaluation. That I was going to make sure that Alphonzo Lemmings got the message and will call him back with her recommendations.

## 2023-01-05 ENCOUNTER — Ambulatory Visit (INDEPENDENT_AMBULATORY_CARE_PROVIDER_SITE_OTHER): Payer: Medicare Other | Admitting: Family Medicine

## 2023-01-05 ENCOUNTER — Encounter: Payer: Self-pay | Admitting: Family Medicine

## 2023-01-05 VITALS — BP 153/92 | HR 51 | Resp 18 | Ht 68.0 in | Wt 258.0 lb

## 2023-01-05 DIAGNOSIS — B9689 Other specified bacterial agents as the cause of diseases classified elsewhere: Secondary | ICD-10-CM

## 2023-01-05 DIAGNOSIS — H8101 Meniere's disease, right ear: Secondary | ICD-10-CM | POA: Diagnosis not present

## 2023-01-05 DIAGNOSIS — G4700 Insomnia, unspecified: Secondary | ICD-10-CM | POA: Diagnosis not present

## 2023-01-05 DIAGNOSIS — E785 Hyperlipidemia, unspecified: Secondary | ICD-10-CM | POA: Diagnosis not present

## 2023-01-05 DIAGNOSIS — I10 Essential (primary) hypertension: Secondary | ICD-10-CM | POA: Diagnosis not present

## 2023-01-05 DIAGNOSIS — J019 Acute sinusitis, unspecified: Secondary | ICD-10-CM

## 2023-01-05 MED ORDER — GUAIFENESIN ER 600 MG PO TB12
600.0000 mg | ORAL_TABLET | Freq: Two times a day (BID) | ORAL | 0 refills | Status: DC
Start: 2023-01-05 — End: 2023-06-01

## 2023-01-05 MED ORDER — FLUTICASONE PROPIONATE 50 MCG/ACT NA SUSP
2.0000 | Freq: Every day | NASAL | 6 refills | Status: DC
Start: 2023-01-05 — End: 2023-06-01

## 2023-01-05 MED ORDER — AMOXICILLIN-POT CLAVULANATE 875-125 MG PO TABS
1.0000 | ORAL_TABLET | Freq: Two times a day (BID) | ORAL | 0 refills | Status: DC
Start: 2023-01-05 — End: 2023-02-16

## 2023-01-05 NOTE — Progress Notes (Signed)
Established Patient Office Visit  Subjective   Patient ID: James Andersen, male    DOB: 12-16-1960  Age: 62 y.o. MRN: [id number]  Chief Complaint  Patient presents with   Hypertension   Nasal Congestion    Chest congestion    HPI James Andersen is a 62 y.o. male presenting today for follow up of hypertension, hyperlipidemia, insomnia, Mnire's disease.  He also has an acute complaint of nasal and chest congestion that started after his wife brought some magnolias that she picked outside into the house.  The congestion started about 5 days ago and has worsened since that time, in addition to yellow rhinorrhea and sinus pressure.  He also reports headaches the first several days.  The congestion causes some difficulty breathing particularly when he lays down which is impacting his ability to sleep. Hypertension: Patient here for follow-up of elevated blood pressure.  Pt denies chest pain, SOB, dizziness, edema, syncope, fatigue or heart palpitations. Taking triamterene-hydrochlorothiazide, reports excellent compliance with treatment. Denies side effects. Hyperlipidemia: Reported that previously he developed an intolerance to statins due to myalgias, at last appointment discussed trial of Zetia instead.  Upon picking up the prescription, wife reports that he also developed myalgias with Zetia in the past and he refused to start it.  Instead, she put him on rosuvastatin 10 mg every other day, which she has thus far tolerated well. The 10-year ASCVD risk score (Arnett DK, et al., 2019) is: 15.4% Insomnia: He was previously tapering off Xanax, currently taking about half tablet nightly.  His biggest trouble sleeping at the moment is the nasal congestion and sinus pressure.  He would like to resolve this issue and then work on resetting his circadian rhythm so that he is not dependent on medication. Mnire's disease: Patient's wife states that they were unable to pick up betahistine, so at  last visit decided to start triamterene-HCTZ for both blood pressure and Mnire's disease.   ROS Negative unless otherwise noted in HPI   Objective:     BP (!) 153/92 (BP Location: Right Arm, Patient Position: Sitting, Cuff Size: Large)   Pulse (!) 51   Resp 18   Ht 5\' 8"  (1.727 m)   Wt 258 lb (117 kg)   SpO2 95%   BMI 39.23 kg/m   Physical Exam Constitutional:      General: He is not in acute distress.    Appearance: Normal appearance.  HENT:     Head: Normocephalic and atraumatic.     Right Ear: Tympanic membrane, ear canal and external ear normal.     Left Ear: Tympanic membrane, ear canal and external ear normal.     Nose: Congestion and rhinorrhea present.     Right Sinus: Maxillary sinus tenderness present.     Left Sinus: Maxillary sinus tenderness present.     Mouth/Throat:     Mouth: Mucous membranes are moist.     Pharynx: Posterior oropharyngeal erythema (Mild) present.  Eyes:     Extraocular Movements: Extraocular movements intact.     Conjunctiva/sclera: Conjunctivae normal.     Pupils: Pupils are equal, round, and reactive to light.  Cardiovascular:     Rate and Rhythm: Normal rate and regular rhythm.     Pulses: Normal pulses.     Heart sounds: Normal heart sounds. No murmur heard.    No friction rub. No gallop.  Pulmonary:     Effort: Pulmonary effort is normal. No respiratory distress.     Breath  sounds: Normal breath sounds. No wheezing, rhonchi or rales.  Musculoskeletal:     Cervical back: Normal range of motion.  Lymphadenopathy:     Cervical: No cervical adenopathy.  Skin:    General: Skin is warm and dry.  Neurological:     Mental Status: He is alert and oriented to person, place, and time.  Psychiatric:        Mood and Affect: Mood normal.     Assessment & Plan:  Essential hypertension Assessment & Plan: BP goal <130/80.  Blood pressure initially 152/95, on repeat 153/92.  Blood pressure has been lower at other office visits outside  of primary care, I am also unsure how much his blood pressure may be impacted by his acute illness and the medications that he has been taking for it.  Continue triamterene-HCTZ 37.5-25 mg daily.  Collecting CMP for medication monitoring today.  At follow-up appointment in 6 weeks, we will reassess blood pressure and may increase to triamterene-HCTZ 70-50 mg daily.  Will continue to monitor.  Orders: -     Comprehensive metabolic panel; Future  Hyperlipidemia, unspecified hyperlipidemia type Assessment & Plan: Repeating lipid panel today.  We discussed that myalgias are not typically a common side effect of ezetimibe.  If tolerating well, continue rosuvastatin 10 mg every other day.  If cholesterol still not at goal, we may consider adding ezetimibe 10 mg daily, I am hesitant to increase rosuvastatin to every day because of previous side effects.  Will continue to monitor.  Orders: -     Lipid panel; Future  Insomnia, unspecified type Assessment & Plan: For now, continue Xanax 0.5 mg nightly for sleep.  At follow-up appointment in 6 weeks, after congestion is resolved, we will discuss other medication options that may be helpful in resetting circadian rhythm including ramelteon or Belsomra.  We may also consider a sleep study.  Patient struggles with sleep maintenance.   Meniere disease, right Assessment & Plan: Continue triamterene-HCTZ 37.5-25 mg daily.  Will continue to monitor.  In the future, may also consider referral to ENT.   Acute bacterial rhinosinusitis -     guaiFENesin ER; Take 1 tablet (600 mg total) by mouth 2 (two) times daily.  Dispense: 30 tablet; Refill: 0 -     Fluticasone Propionate; Place 2 sprays into both nostrils daily.  Dispense: 16 g; Refill: 6 -     Amoxicillin-Pot Clavulanate; Take 1 tablet by mouth 2 (two) times daily.  Dispense: 10 tablet; Refill: 0  Given length and course of symptoms, reasonable to assume congestion/rhinorrhea/sinus pressure due to acute  bacterial infection at this point.  Continue Xyzal for allergies every evening.  Recommended discontinuing the nasal spray provided by a different provider, provided prescription for fluticasone spray and guaifenesin.  We discussed the importance of maintaining adequate hydration while taking guaifenesin, also reassured that this medication is safe for blood pressure.  Will also treat for acute bacterial rhinosinusitis with a 5-day course of Augmentin 875 mg twice daily.  Patient verbalized understanding and is agreeable with this plan.  Return in about 6 weeks (around 02/16/2023) for follow-up for HTN and sleep.   I spent 45 minutes on the day of the encounter to include pre-visit record review, face-to-face time with the patient and post visit ordering of test.  Melida Quitter, PA

## 2023-01-05 NOTE — Assessment & Plan Note (Signed)
Continue triamterene-HCTZ 37.5-25 mg daily.  Will continue to monitor.  In the future, may also consider referral to ENT.

## 2023-01-05 NOTE — Assessment & Plan Note (Signed)
BP goal <130/80.  Blood pressure initially 152/95, on repeat 153/92.  Blood pressure has been lower at other office visits outside of primary care, I am also unsure how much his blood pressure may be impacted by his acute illness and the medications that he has been taking for it.  Continue triamterene-HCTZ 37.5-25 mg daily.  Collecting CMP for medication monitoring today.  At follow-up appointment in 6 weeks, we will reassess blood pressure and may increase to triamterene-HCTZ 70-50 mg daily.  Will continue to monitor.

## 2023-01-05 NOTE — Assessment & Plan Note (Signed)
Repeating lipid panel today.  We discussed that myalgias are not typically a common side effect of ezetimibe.  If tolerating well, continue rosuvastatin 10 mg every other day.  If cholesterol still not at goal, we may consider adding ezetimibe 10 mg daily, I am hesitant to increase rosuvastatin to every day because of previous side effects.  Will continue to monitor.

## 2023-01-05 NOTE — Assessment & Plan Note (Signed)
For now, continue Xanax 0.5 mg nightly for sleep.  At follow-up appointment in 6 weeks, after congestion is resolved, we will discuss other medication options that may be helpful in resetting circadian rhythm including ramelteon or Belsomra.  We may also consider a sleep study.  Patient struggles with sleep maintenance.

## 2023-01-06 ENCOUNTER — Other Ambulatory Visit: Payer: Self-pay | Admitting: Family Medicine

## 2023-01-06 DIAGNOSIS — G4700 Insomnia, unspecified: Secondary | ICD-10-CM

## 2023-01-06 DIAGNOSIS — H8101 Meniere's disease, right ear: Secondary | ICD-10-CM

## 2023-01-06 DIAGNOSIS — E785 Hyperlipidemia, unspecified: Secondary | ICD-10-CM

## 2023-01-06 LAB — COMPREHENSIVE METABOLIC PANEL
ALT: 31 IU/L (ref 0–44)
AST: 52 IU/L — ABNORMAL HIGH (ref 0–40)
Albumin/Globulin Ratio: 1.6
Albumin: 4.6 g/dL (ref 3.9–4.9)
Alkaline Phosphatase: 75 IU/L (ref 44–121)
BUN/Creatinine Ratio: 11 (ref 10–24)
BUN: 15 mg/dL (ref 8–27)
Bilirubin Total: 0.4 mg/dL (ref 0.0–1.2)
CO2: 23 mmol/L (ref 20–29)
Calcium: 9.7 mg/dL (ref 8.6–10.2)
Chloride: 102 mmol/L (ref 96–106)
Creatinine, Ser: 1.31 mg/dL — ABNORMAL HIGH (ref 0.76–1.27)
Globulin, Total: 2.9 g/dL (ref 1.5–4.5)
Glucose: 94 mg/dL (ref 70–99)
Potassium: 3.9 mmol/L (ref 3.5–5.2)
Sodium: 140 mmol/L (ref 134–144)
Total Protein: 7.5 g/dL (ref 6.0–8.5)
eGFR: 61 mL/min/{1.73_m2} (ref >59)

## 2023-01-06 LAB — LIPID PANEL
Chol/HDL Ratio: 5.6 ratio — ABNORMAL HIGH (ref 0.0–5.0)
Cholesterol, Total: 333 mg/dL — ABNORMAL HIGH (ref 100–199)
HDL: 60 mg/dL (ref >39)
LDL Chol Calc (NIH): 257 mg/dL — ABNORMAL HIGH (ref 0–99)
Triglycerides: 97 mg/dL (ref 0–149)
VLDL Cholesterol Cal: 16 mg/dL (ref 5–40)

## 2023-01-09 LAB — T3, FREE: T3, Free: 0.4 pg/mL — ABNORMAL LOW (ref 2.0–4.4)

## 2023-01-09 LAB — T4, FREE

## 2023-01-09 LAB — TSH: TSH: 84 u[IU]/mL — ABNORMAL HIGH (ref 0.450–4.500)

## 2023-02-14 ENCOUNTER — Other Ambulatory Visit: Payer: Self-pay | Admitting: Family Medicine

## 2023-02-14 DIAGNOSIS — E785 Hyperlipidemia, unspecified: Secondary | ICD-10-CM

## 2023-02-15 LAB — CBC WITH DIFFERENTIAL/PLATELET
Basophils Absolute: 0.1 10*3/uL (ref 0.0–0.2)
Basos: 1 %
EOS (ABSOLUTE): 0.2 10*3/uL (ref 0.0–0.4)
Eos: 3 %
Hematocrit: 42.3 % (ref 37.5–51.0)
Hemoglobin: 13.9 g/dL (ref 13.0–17.7)
Immature Grans (Abs): 0 10*3/uL (ref 0.0–0.1)
Immature Granulocytes: 1 %
Lymphocytes Absolute: 2 10*3/uL (ref 0.7–3.1)
Lymphs: 29 %
MCH: 28.8 pg (ref 26.6–33.0)
MCHC: 32.9 g/dL (ref 31.5–35.7)
MCV: 88 fL (ref 79–97)
Monocytes Absolute: 0.7 10*3/uL (ref 0.1–0.9)
Monocytes: 10 %
Neutrophils Absolute: 4.1 10*3/uL (ref 1.4–7.0)
Neutrophils: 56 %
Platelets: 178 10*3/uL (ref 150–450)
RBC: 4.83 x10E6/uL (ref 4.14–5.80)
RDW: 15.8 % — ABNORMAL HIGH (ref 11.6–15.4)
WBC: 7.1 10*3/uL (ref 3.4–10.8)

## 2023-02-15 LAB — COMPREHENSIVE METABOLIC PANEL
ALT: 18 IU/L (ref 0–44)
AST: 31 IU/L (ref 0–40)
Albumin: 4.7 g/dL (ref 3.9–4.9)
Alkaline Phosphatase: 63 IU/L (ref 44–121)
BUN/Creatinine Ratio: 14 (ref 10–24)
BUN: 18 mg/dL (ref 8–27)
Bilirubin Total: 0.5 mg/dL (ref 0.0–1.2)
CO2: 24 mmol/L (ref 20–29)
Calcium: 9.3 mg/dL (ref 8.6–10.2)
Chloride: 101 mmol/L (ref 96–106)
Creatinine, Ser: 1.25 mg/dL (ref 0.76–1.27)
Globulin, Total: 2.3 g/dL (ref 1.5–4.5)
Glucose: 103 mg/dL — ABNORMAL HIGH (ref 70–99)
Potassium: 4 mmol/L (ref 3.5–5.2)
Sodium: 141 mmol/L (ref 134–144)
Total Protein: 7 g/dL (ref 6.0–8.5)
eGFR: 64 mL/min/{1.73_m2} (ref >59)

## 2023-02-15 LAB — LIPID PANEL
Chol/HDL Ratio: 3 ratio (ref 0.0–5.0)
Cholesterol, Total: 140 mg/dL (ref 100–199)
HDL: 46 mg/dL (ref >39)
LDL Chol Calc (NIH): 76 mg/dL (ref 0–99)
Triglycerides: 96 mg/dL (ref 0–149)
VLDL Cholesterol Cal: 18 mg/dL (ref 5–40)

## 2023-02-16 ENCOUNTER — Ambulatory Visit (INDEPENDENT_AMBULATORY_CARE_PROVIDER_SITE_OTHER): Payer: Medicare Other | Admitting: Family Medicine

## 2023-02-16 ENCOUNTER — Telehealth: Payer: Self-pay | Admitting: *Deleted

## 2023-02-16 ENCOUNTER — Encounter: Payer: Self-pay | Admitting: Family Medicine

## 2023-02-16 VITALS — BP 155/93 | HR 54 | Ht 68.0 in | Wt 264.8 lb

## 2023-02-16 DIAGNOSIS — H8101 Meniere's disease, right ear: Secondary | ICD-10-CM | POA: Diagnosis not present

## 2023-02-16 DIAGNOSIS — E7800 Pure hypercholesterolemia, unspecified: Secondary | ICD-10-CM | POA: Diagnosis not present

## 2023-02-16 DIAGNOSIS — I10 Essential (primary) hypertension: Secondary | ICD-10-CM | POA: Diagnosis not present

## 2023-02-16 MED ORDER — TRIAMTERENE-HCTZ 75-50 MG PO TABS
1.0000 | ORAL_TABLET | Freq: Every day | ORAL | 1 refills | Status: DC
Start: 2023-02-16 — End: 2023-05-08

## 2023-02-16 NOTE — Progress Notes (Signed)
   Established Patient Office Visit  Subjective   Patient ID: James Andersen, male    DOB: 28-Dec-1960  Age: 62 y.o. MRN: [id number]  Chief Complaint  Patient presents with   Hypertension   Sleep Follow Up    HPI James Andersen is a 62 y.o. male presenting today for follow up of hypertension, hyperlipidemia, sleep. Hypertension:  Pt denies chest pain, SOB, dizziness, edema, syncope, or heart palpitations. Taking triamterene-HCTZ, reports excellent compliance with treatment. Denies side effects. Hyperlipidemia: tolerating rosuvastatin nightly well with no myalgias or significant side effects.  The 10-year ASCVD risk score (Arnett DK, et al., 2019) is: 15.3% Insomnia: He struggles with sleep maintenance. He has completely weaned himself off of Xanax at this point.  Instead, if he does need help with sleep he will take one half of his wife's trazodone and some Benadryl.  He snores occasionally but has had no witnessed episodes of apnea.  He does have fatigue throughout the day but believes it is more so due to his thyroid than sleep.  ROS Negative unless otherwise noted in HPI   Objective:     BP (!) 155/93   Pulse (!) 54   Ht 5\' 8"  (1.727 m)   Wt 264 lb 12 oz (120.1 kg)   SpO2 97%   BMI 40.26 kg/m   Physical Exam Constitutional:      General: He is not in acute distress.    Appearance: Normal appearance.  HENT:     Head: Normocephalic and atraumatic.  Cardiovascular:     Rate and Rhythm: Normal rate and regular rhythm.     Heart sounds: Normal heart sounds. No murmur heard.    No friction rub. No gallop.  Pulmonary:     Effort: Pulmonary effort is normal. No respiratory distress.     Breath sounds: Normal breath sounds. No wheezing, rhonchi or rales.  Skin:    General: Skin is warm and dry.  Neurological:     Mental Status: He is alert and oriented to person, place, and time.  Psychiatric:        Mood and Affect: Mood normal.      Assessment & Plan:   Essential hypertension Assessment & Plan: BP goal <130/80.  Blood pressure has been consistently above goal for several months now.  Increase to triamterene-HCTZ 70-50 mg daily.  We will check CMP for medication monitoring at next visit.  Will continue to monitor.  Orders: -     Triamterene-HCTZ; Take 1 tablet by mouth daily.  Dispense: 60 tablet; Refill: 1  Meniere disease, right Assessment & Plan: Increase to triamterene-HCTZ 70-50 mg daily.  Will continue to monitor.  In the future, may also consider referral to ENT.  Orders: -     Triamterene-HCTZ; Take 1 tablet by mouth daily.  Dispense: 60 tablet; Refill: 1  Pure hypercholesterolemia Assessment & Plan: Last lipid panel: LDL 76, HDL 46, triglycerides 96.  LDL has decreased significantly to 76 from 257.  Continue rosuvastatin 10 mg daily as long as he is tolerating it well.  Back pain likely due to arthritic changes.  Will continue to monitor.  If he does develop side effects, we could consider decreasing the dose of rosuvastatin and adding ezetimibe.  Will continue to monitor.     Return in about 3 months (around 05/19/2023) for follow-up for HTN, HLD, sleep.    Melida Quitter, PA

## 2023-02-16 NOTE — Assessment & Plan Note (Signed)
Last lipid panel: LDL 76, HDL 46, triglycerides 96.  LDL has decreased significantly to 76 from 257.  Continue rosuvastatin 10 mg daily as long as he is tolerating it well.  Back pain likely due to arthritic changes.  Will continue to monitor.  If he does develop side effects, we could consider decreasing the dose of rosuvastatin and adding ezetimibe.  Will continue to monitor.

## 2023-02-16 NOTE — Patient Instructions (Addendum)
You can start by taking 2 tablets of your blood pressure medicine to finish the bottle that you just picked up.  Once you finish those, you can start the new prescription that I sent at the higher dose.  As I mentioned, I would be cautious about taking Benadryl on a regular basis.  For allergies specifically, I recommend something like Zyrtec, Claritin, or Allegra as they tend to be a little bit safer with fewer side effects.  You can take them as needed or daily to prevent allergies from flaring.  Call your insurance company to make sure that they will cover a home sleep study and you can let me know the next time that I see you.

## 2023-02-16 NOTE — Assessment & Plan Note (Signed)
Increase to triamterene-HCTZ 70-50 mg daily.  Will continue to monitor.  In the future, may also consider referral to ENT.

## 2023-02-16 NOTE — Telephone Encounter (Signed)
Patient left a message stating that is short of breathe. He had just left his PCP. He is asking that you please look at his lab work and see if this is why he is short of breathe.He would appreciate a call back.

## 2023-02-16 NOTE — Telephone Encounter (Signed)
I looked at the labs done by his PCP and I did not see anything that would explain his shortness of breath.  They did not check thyroid labs this blood draw.

## 2023-02-16 NOTE — Telephone Encounter (Signed)
Patient was called and given [name]'s recommendation. He also ask if he could get his thyroid labs done early. Per Alphonzo Lemmings the patient may do so, he will be going to [facility] and get this done.

## 2023-02-16 NOTE — Assessment & Plan Note (Signed)
BP goal <130/80.  Blood pressure has been consistently above goal for several months now.  Increase to triamterene-HCTZ 70-50 mg daily.  We will check CMP for medication monitoring at next visit.  Will continue to monitor.

## 2023-02-20 ENCOUNTER — Encounter: Payer: Self-pay | Admitting: Nurse Practitioner

## 2023-02-20 ENCOUNTER — Other Ambulatory Visit: Payer: Self-pay | Admitting: Nurse Practitioner

## 2023-02-20 DIAGNOSIS — E890 Postprocedural hypothyroidism: Secondary | ICD-10-CM

## 2023-02-20 MED ORDER — LEVOTHYROXINE SODIUM 100 MCG PO TABS: 100.0000 ug | ORAL_TABLET | Freq: Every day | ORAL | 1 refills | Status: DC

## 2023-03-05 ENCOUNTER — Other Ambulatory Visit: Payer: Self-pay | Admitting: Family Medicine

## 2023-03-14 ENCOUNTER — Ambulatory Visit: Payer: Medicare Other | Admitting: Nurse Practitioner

## 2023-04-10 ENCOUNTER — Other Ambulatory Visit: Payer: Self-pay | Admitting: *Deleted

## 2023-04-10 DIAGNOSIS — I10 Essential (primary) hypertension: Secondary | ICD-10-CM

## 2023-04-10 MED ORDER — METOPROLOL TARTRATE 50 MG PO TABS
25.0000 mg | ORAL_TABLET | Freq: Two times a day (BID) | ORAL | 1 refills | Status: DC
Start: 2023-04-10 — End: 2024-03-29

## 2023-04-10 NOTE — Telephone Encounter (Signed)
Pt calling to say the he would like to know if his PCP could start prescribing the below medications, he said that in the past the PCP has filled it but cariology filled it last time and if so to please send refills to the below pharmacy. Please advise   metoprolol tartrate (LOPRESSOR) 50 MG tablet     [facility] [address] - [place], [place] - [address] AT [facility]  [address] [place], [place] [address]

## 2023-04-10 NOTE — Telephone Encounter (Signed)
Prescription sent, please let him know that he needs to follow up with cardiology. He is overdue for follow up which should have been in December 2023. They will likely adjust his BP meds.

## 2023-04-12 ENCOUNTER — Other Ambulatory Visit: Payer: Self-pay | Admitting: Nurse Practitioner

## 2023-04-13 ENCOUNTER — Encounter: Payer: Self-pay | Admitting: Nurse Practitioner

## 2023-04-13 DIAGNOSIS — E890 Postprocedural hypothyroidism: Secondary | ICD-10-CM

## 2023-04-13 LAB — SPECIMEN STATUS REPORT

## 2023-04-13 LAB — TSH: TSH: 7.2 u[IU]/mL — ABNORMAL HIGH (ref 0.450–4.500)

## 2023-04-13 LAB — T4, FREE: Free T4: 1.41 ng/dL (ref 0.82–1.77)

## 2023-04-14 LAB — SPECIMEN STATUS REPORT

## 2023-04-14 MED ORDER — LEVOTHYROXINE SODIUM 112 MCG PO TABS
112.0000 ug | ORAL_TABLET | Freq: Every day | ORAL | 1 refills | Status: DC
Start: 2023-04-14 — End: 2023-06-12

## 2023-04-14 NOTE — Telephone Encounter (Signed)
Can you take him off for 9/25 and reschedule for 8 weeks out?  Will need labs which I have ordered.

## 2023-04-19 ENCOUNTER — Ambulatory Visit: Payer: Medicare Other | Admitting: Nurse Practitioner

## 2023-04-26 ENCOUNTER — Telehealth: Payer: Self-pay

## 2023-04-26 ENCOUNTER — Other Ambulatory Visit: Payer: Self-pay | Admitting: Family Medicine

## 2023-04-26 DIAGNOSIS — E785 Hyperlipidemia, unspecified: Secondary | ICD-10-CM

## 2023-04-26 NOTE — Telephone Encounter (Signed)
Should patient be taking Lisinopril, Amlodipine, Metoprolol, and Triamterene-hydrochlorothiazide?

## 2023-04-26 NOTE — Telephone Encounter (Signed)
Discontinued lisinopril when we switched to triamterene-hydrochlorothiazide. Should be taking Amlodipine, Metoprolol, and Triamterene-hydrochlorothiazide  currently.

## 2023-04-26 NOTE — Telephone Encounter (Signed)
Patient was informed that he is to D/C Lisinopril and continue taking Amlodipine, Metoprolol, Maxzide. Patient verbalized understanding. All question and concerns have been addressed.

## 2023-04-26 NOTE — Telephone Encounter (Signed)
Prescription Request  04/26/2023  LOV: 02/16/23  What is the name of the medication or equipment? lisinopril (ZESTRIL) 40 MG tablet   Have you contacted your pharmacy to request a refill? Yes   Which pharmacy would you like this sent to?  [facility] [address] [facility] - [place], [place] - [address] AT [facility] [address] [place] [address] Phone: [phone] Fax: [phone]    Patient notified that their request is being sent to the clinical staff for review and that they should receive a response within 2 business days.   Please advise at Mobile [phone] (mobile)

## 2023-05-08 ENCOUNTER — Encounter: Payer: Self-pay | Admitting: Family Medicine

## 2023-05-08 DIAGNOSIS — I10 Essential (primary) hypertension: Secondary | ICD-10-CM

## 2023-05-08 MED ORDER — HYDROCHLOROTHIAZIDE 25 MG PO TABS
25.0000 mg | ORAL_TABLET | Freq: Every day | ORAL | 2 refills | Status: DC
Start: 2023-05-08 — End: 2023-06-13

## 2023-05-08 MED ORDER — TRIAMTERENE 100 MG PO CAPS
100.0000 mg | ORAL_CAPSULE | Freq: Every day | ORAL | 2 refills | Status: DC
Start: 2023-05-08 — End: 2023-06-15

## 2023-05-10 ENCOUNTER — Emergency Department: Payer: Medicare Other

## 2023-05-10 ENCOUNTER — Other Ambulatory Visit: Payer: Self-pay

## 2023-05-10 ENCOUNTER — Emergency Department
Admission: EM | Admit: 2023-05-10 | Discharge: 2023-05-10 | Disposition: A | Discharge status: Home / Self Care | Payer: Medicare Other | Attending: Emergency Medicine | Admitting: Emergency Medicine

## 2023-05-10 DIAGNOSIS — I10 Essential (primary) hypertension: Secondary | ICD-10-CM | POA: Diagnosis not present

## 2023-05-10 DIAGNOSIS — W228XXA Striking against or struck by other objects, initial encounter: Secondary | ICD-10-CM | POA: Diagnosis not present

## 2023-05-10 DIAGNOSIS — S52572A Other intraarticular fracture of lower end of left radius, initial encounter for closed fracture: Secondary | ICD-10-CM | POA: Diagnosis not present

## 2023-05-10 DIAGNOSIS — M25532 Pain in left wrist: Secondary | ICD-10-CM | POA: Diagnosis present

## 2023-05-10 MED ORDER — OXYCODONE HCL 5 MG PO TABS
5.0000 mg | ORAL_TABLET | Freq: Once | ORAL | Status: AC
  Administered 2023-05-10: 5 mg via ORAL
  Filled 2023-05-10: qty 1

## 2023-05-10 MED ORDER — OXYCODONE HCL 5 MG PO TABS
5.0000 mg | ORAL_TABLET | ORAL | 0 refills | Status: DC | PRN
Start: 2023-05-10 — End: 2023-06-12
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
## 2023-05-10 NOTE — Discharge Instructions (Addendum)
Keep your splint clean and dry.  Keep your arm elevated to help with the swelling.  Please follow-up with orthopedics.  Please return for any new, worsening, or changing symptoms or other concerns.  It was a pleasure caring for you today.
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
## 2023-05-25 ENCOUNTER — Ambulatory Visit: Payer: Medicare Other | Admitting: Family Medicine

## 2023-06-01 ENCOUNTER — Ambulatory Visit (INDEPENDENT_AMBULATORY_CARE_PROVIDER_SITE_OTHER): Payer: Medicare Other | Admitting: Family Medicine

## 2023-06-01 ENCOUNTER — Ambulatory Visit: Payer: Medicare Other | Attending: Family Medicine

## 2023-06-01 ENCOUNTER — Encounter: Payer: Self-pay | Admitting: Family Medicine

## 2023-06-01 VITALS — BP 138/88 | HR 42 | Resp 18 | Ht 68.0 in | Wt 263.0 lb

## 2023-06-01 DIAGNOSIS — H8101 Meniere's disease, right ear: Secondary | ICD-10-CM | POA: Diagnosis not present

## 2023-06-01 DIAGNOSIS — R002 Palpitations: Secondary | ICD-10-CM | POA: Diagnosis not present

## 2023-06-01 DIAGNOSIS — R0602 Shortness of breath: Secondary | ICD-10-CM

## 2023-06-01 DIAGNOSIS — I10 Essential (primary) hypertension: Secondary | ICD-10-CM

## 2023-06-01 DIAGNOSIS — G4700 Insomnia, unspecified: Secondary | ICD-10-CM | POA: Diagnosis not present

## 2023-06-01 MED ORDER — TRAZODONE HCL 50 MG PO TABS
25.0000 mg | ORAL_TABLET | Freq: Every evening | ORAL | 3 refills | Status: DC | PRN
Start: 2023-06-01 — End: 2023-11-27

## 2023-06-01 NOTE — Assessment & Plan Note (Signed)
Use trazodone 25 mg nightly for sleep as needed.  Given shortness of breath, if not related to heart or lungs strongly recommend sleep study.

## 2023-06-01 NOTE — Assessment & Plan Note (Signed)
Increase to triamterene-HCTZ 70-50 mg daily.  Will continue to monitor.  In the future, may also consider referral to ENT.

## 2023-06-01 NOTE — Progress Notes (Signed)
Established Patient Office Visit  Subjective   Patient ID: James Andersen, male    DOB: 1960-12-10  Age: 62 y.o. MRN: [id number]  Chief Complaint  Patient presents with   Hypertension    HPI James Andersen is a 62 y.o. male presenting today for follow up of hypertension, hyperlipidemia, sleep. Patient complains of shortness of breath at rest, and with activity, even walking short distances .  Symptoms include difficulty breathing, dyspnea on exertion, and dyspnea when laying down. Symptoms began several weeks ago, fairly stable since that time.  Patient denies constant cough, cough after eating, drainage from nose, dry cough, frequent throat clearing, morning cough, post nasal drip, shortness of breath, sputum production, tightness in chest, unresolving pneumonia, wheezing, and chest pain. Denies edema of extremities .  Denies associated symptoms including chills,  ear congestion, ear pain, eye irritation, facial pain, fever, headache, myalgias, nasal congestion, rhinorrhea , sneezing, sore throat, sweats, weight loss, and wheezing. Patient has not had recent travel.  Weight has increased 16 pounds over last 7, on diuretics.  Appetite has been increased. Symptoms seem to occur without any identified trigger and are alleviated randomly as they occur occasionally at rest sometimes too. Hypertension:  Pt denies chest pain, SOB, dizziness, edema, syncope, or heart palpitations. Taking triamterene-HCTZ, reports excellent compliance with treatment. Denies side effects.  Hyperlipidemia: tolerating rosuvastatin 10 mg daily well with no myalgias or significant side effects.  The 10-year ASCVD risk score (Arnett DK, et al., 2019) is: 12.6% Insomnia: He struggles with sleep maintenance.  At last visit, reported completely discontinuing Xanax and taking trazodone with Benadryl only as needed.    Outpatient Medications Prior to Visit  Medication Sig   albuterol (VENTOLIN HFA) 108 (90 Base)  MCG/ACT inhaler Inhale 1 puff into the lungs every 6 (six) hours as needed for wheezing or shortness of breath.   aspirin EC 81 MG tablet Take 81 mg by mouth daily. Swallow whole.   hydrochlorothiazide (HYDRODIURIL) 25 MG tablet Take 1 tablet (25 mg total) by mouth daily.   levothyroxine (SYNTHROID) 112 MCG tablet Take 1 tablet (112 mcg total) by mouth daily before breakfast.   metoprolol tartrate (LOPRESSOR) 50 MG tablet Take 0.5 tablets (25 mg total) by mouth 2 (two) times daily.   oxyCODONE (ROXICODONE) 5 MG immediate release tablet Take 1 tablet (5 mg total) by mouth every 4 (four) hours as needed for severe pain (pain score 7-10).   rosuvastatin (CRESTOR) 10 MG tablet TAKE ONE TABLET BY MOUTH ONE TIME DAILY   triamterene (DYRENIUM) 100 MG capsule Take 1 capsule (100 mg total) by mouth daily.   [DISCONTINUED] fluticasone (FLONASE) 50 MCG/ACT nasal spray Place 2 sprays into both nostrils daily.   [DISCONTINUED] guaiFENesin (MUCINEX) 600 MG 12 hr tablet Take 1 tablet (600 mg total) by mouth 2 (two) times daily.   No facility-administered medications prior to visit.    ROS Negative unless otherwise noted in HPI   Objective:     BP 138/88   Pulse (!) 42   Resp 18   Ht 5\' 8"  (1.727 m)   Wt 263 lb (119.3 kg)   SpO2 98%   BMI 39.99 kg/m   Physical Exam Constitutional:      General: He is not in acute distress.    Appearance: Normal appearance.  HENT:     Head: Normocephalic and atraumatic.  Cardiovascular:     Rate and Rhythm: Normal rate and regular rhythm.     Heart sounds:  Normal heart sounds. No murmur heard.    No friction rub. No gallop.  Pulmonary:     Effort: Pulmonary effort is normal. No respiratory distress.     Breath sounds: Normal breath sounds. No wheezing, rhonchi or rales.  Musculoskeletal:     Right lower leg: No edema.     Left lower leg: No edema.  Skin:    General: Skin is warm and dry.  Neurological:     Mental Status: He is alert and oriented to  person, place, and time.  Psychiatric:        Mood and Affect: Mood normal.     Assessment & Plan:  Essential hypertension Assessment & Plan: BP goal <130/80.  Blood pressure has been consistently above goal for several months now.  Continue triamterene-HCTZ 70-50 mg daily.  Keep ambulatory blood pressure log and send to primary care provider in 1 week.  Will continue to monitor.  If blood pressure remains elevated, we will need to adjust medication regimen.  Also encourage patient to follow-up with cardiology.   Meniere disease, right Assessment & Plan: Increase to triamterene-HCTZ 70-50 mg daily.  Will continue to monitor.  In the future, may also consider referral to ENT.   Insomnia, unspecified type Assessment & Plan: Use trazodone 25 mg nightly for sleep as needed.  Given shortness of breath, if not related to heart or lungs strongly recommend sleep study.  Orders: -     traZODone HCl; Take 0.5 tablets (25 mg total) by mouth at bedtime as needed for sleep.  Dispense: 45 tablet; Refill: 3  Palpitations -     ECHOCARDIOGRAM COMPLETE; Future -     LONG TERM MONITOR (3-14 DAYS); Future  Shortness of breath -     ECHOCARDIOGRAM COMPLETE; Future -     DG Chest 2 View; Future  Ambulatory heart monitor and echocardiogram to workup palpitations and new shortness of breath.  Strongly encourage patient to schedule follow-up with cardiology as he is 1 year overdue.  Return in about 4 weeks (around 06/29/2023) for follow-up for HTN and dyspnea.    Melida Quitter, PA

## 2023-06-01 NOTE — Assessment & Plan Note (Addendum)
BP goal <130/80.  Blood pressure has been consistently above goal for several months now.  Continue triamterene-HCTZ 70-50 mg daily.  Keep ambulatory blood pressure log and send to primary care provider in 1 week.  Will continue to monitor.  If blood pressure remains elevated, we will need to adjust medication regimen.  Also encourage patient to follow-up with cardiology.

## 2023-06-01 NOTE — Progress Notes (Unsigned)
EP to read

## 2023-06-01 NOTE — Patient Instructions (Addendum)
Please call cardiology to get scheduled as soon as possible!  The chest x-ray order has been sent to [facility].  They take walk-in appointments, so go in the next day or so and they will be able to have it done.  Bring or send your blood pressure log in 1 week. The goal is to have it consistently less than 130 on the top AND less than 80 on the bottom.

## 2023-06-07 ENCOUNTER — Ambulatory Visit (HOSPITAL_BASED_OUTPATIENT_CLINIC_OR_DEPARTMENT_OTHER)
Admission: RE | Admit: 2023-06-07 | Discharge: 2023-06-07 | Disposition: A | Discharge status: Home / Self Care | Payer: Medicare Other | Source: Ambulatory Visit | Attending: Family Medicine | Admitting: Family Medicine

## 2023-06-07 ENCOUNTER — Other Ambulatory Visit (HOSPITAL_BASED_OUTPATIENT_CLINIC_OR_DEPARTMENT_OTHER): Payer: Medicare Other

## 2023-06-07 DIAGNOSIS — R002 Palpitations: Secondary | ICD-10-CM

## 2023-06-07 DIAGNOSIS — R0602 Shortness of breath: Secondary | ICD-10-CM | POA: Diagnosis present

## 2023-06-07 LAB — T4, FREE: Free T4: 1.79 ng/dL — ABNORMAL HIGH (ref 0.82–1.77)

## 2023-06-07 LAB — TSH: TSH: 2.83 u[IU]/mL (ref 0.450–4.500)

## 2023-06-08 LAB — ECHOCARDIOGRAM COMPLETE
Area-P 1/2: 4.06 cm2
S' Lateral: 3.11 cm

## 2023-06-08 NOTE — Patient Instructions (Signed)

## 2023-06-09 ENCOUNTER — Encounter: Payer: Self-pay | Admitting: Family Medicine

## 2023-06-09 DIAGNOSIS — I10 Essential (primary) hypertension: Secondary | ICD-10-CM

## 2023-06-12 ENCOUNTER — Ambulatory Visit (INDEPENDENT_AMBULATORY_CARE_PROVIDER_SITE_OTHER): Payer: Medicare Other | Admitting: Nurse Practitioner

## 2023-06-12 ENCOUNTER — Encounter: Payer: Self-pay | Admitting: Nurse Practitioner

## 2023-06-12 VITALS — BP 138/91 | HR 62 | Ht 68.0 in | Wt 264.2 lb

## 2023-06-12 DIAGNOSIS — E890 Postprocedural hypothyroidism: Secondary | ICD-10-CM | POA: Diagnosis not present

## 2023-06-12 MED ORDER — LEVOTHYROXINE SODIUM 112 MCG PO TABS: 112.0000 ug | ORAL_TABLET | Freq: Every day | ORAL | 1 refills | Status: DC
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
## 2023-06-13 ENCOUNTER — Encounter: Payer: Self-pay | Admitting: Cardiovascular Disease

## 2023-06-13 MED ORDER — VALSARTAN-HYDROCHLOROTHIAZIDE 80-12.5 MG PO TABS
1.0000 | ORAL_TABLET | Freq: Every day | ORAL | 1 refills | Status: DC
Start: 2023-06-13 — End: 2023-06-15

## 2023-06-13 NOTE — Addendum Note (Signed)
Addended by: Saralyn Pilar on: 06/13/2023 01:44 PM   Modules accepted: Orders

## 2023-06-15 ENCOUNTER — Ambulatory Visit: Payer: Medicare Other | Attending: Medical | Admitting: Medical

## 2023-06-15 ENCOUNTER — Encounter: Payer: Self-pay | Admitting: Medical

## 2023-06-15 VITALS — BP 154/107 | HR 55 | Ht 69.0 in | Wt 266.8 lb

## 2023-06-15 DIAGNOSIS — Z79899 Other long term (current) drug therapy: Secondary | ICD-10-CM | POA: Insufficient documentation

## 2023-06-15 DIAGNOSIS — I2510 Atherosclerotic heart disease of native coronary artery without angina pectoris: Secondary | ICD-10-CM | POA: Diagnosis present

## 2023-06-15 DIAGNOSIS — I10 Essential (primary) hypertension: Secondary | ICD-10-CM | POA: Diagnosis present

## 2023-06-15 DIAGNOSIS — E0590 Thyrotoxicosis, unspecified without thyrotoxic crisis or storm: Secondary | ICD-10-CM | POA: Diagnosis present

## 2023-06-15 DIAGNOSIS — I77810 Thoracic aortic ectasia: Secondary | ICD-10-CM | POA: Diagnosis present

## 2023-06-15 DIAGNOSIS — E890 Postprocedural hypothyroidism: Secondary | ICD-10-CM | POA: Diagnosis present

## 2023-06-15 DIAGNOSIS — G479 Sleep disorder, unspecified: Secondary | ICD-10-CM | POA: Diagnosis present

## 2023-06-15 DIAGNOSIS — R0609 Other forms of dyspnea: Secondary | ICD-10-CM | POA: Insufficient documentation

## 2023-06-15 MED ORDER — HYDROCHLOROTHIAZIDE 25 MG PO TABS: 25.0000 mg | ORAL_TABLET | Freq: Every day | ORAL | 3 refills | Status: DC

## 2023-06-15 MED ORDER — TRIAMTERENE 100 MG PO CAPS: 100.0000 mg | ORAL_CAPSULE | Freq: Every day | ORAL | 3 refills | Status: DC

## 2023-06-15 NOTE — Patient Instructions (Signed)
Medication Instructions:  Your Physician recommend you continue on your current medication as directed.     [facility] called to verify meds taken and list updated. *If you need a refill on your cardiac medications before your next appointment, please call your pharmacy*  Lab Work: Your provider would like for you to have following labs drawn today CBC and CMT.   If you have labs (blood work) drawn today and your tests are completely normal, you will receive your results only by: MyChart Message (if you have MyChart) OR A paper copy in the mail If you have any lab test that is abnormal or we need to change your treatment, we will call you to review the results.  Follow-Up: At [facility], you and your health needs are our priority.  As part of our continuing mission to provide you with exceptional heart care, we have created designated Provider Care Teams.  These Care Teams include your primary Cardiologist (physician) and Advanced Practice Providers (APPs -  Physician Assistants and Nurse Practitioners) who all work together to provide you with the care you need, when you need it.  We recommend signing up for the patient portal called "MyChart".  Sign up information is provided on this After Visit Summary.  MyChart is used to connect with patients for Virtual Visits (Telemedicine).  Patients are able to view lab/test results, encounter notes, upcoming appointments, etc.  Non-urgent messages can be sent to your provider as well.   To learn more about what you can do with MyChart, go to [email or web address].    Your next appointment:   1 month(s)  Provider:   You may see Lorine Bears, MD or one of the following Advanced Practice Providers on your designated Care Team:   Cadence Silverdale, [place]

## 2023-06-15 NOTE — Progress Notes (Signed)
Cardiology Office Note:    Date:  06/15/2023   ID:  Evern Core, DOB May 03, 1961, MRN [id number]  PCP:  Melida Quitter, PA  [facility] Cardiologist:  Lorine Bears, MD  [facility] Electrophysiologist:  None   Referring MD: Melida Quitter, PA   Chief Complaint: 1 year follow-up  History of Present Illness:    James Andersen is a 62 y.o. male with a hx of mild nonobstructive CAD, palpitations in the setting of hyperthyroidism, hypertension, hyperlipidemia, Mnire's disease, previous tobacco use, and obesity who presents for follow-up.  The patient went hospitalized in March 2023 with fatigue, dyspnea on exertion, feeling jittery and palpitations.  Troponin was mildly elevated to 23 and BNP 70.  TSH was very low.  Echo showed normal LV function, mild pulmonary hypertension, mildly dilated ascending aorta 40 mm.  It was felt symptoms were related to hyperthyroidism.  He was treated with methimazole.  CT scan for lung cancer screening showed no masses but was found to have extensive three-vessel coronary artery calcifications and moderate aortic calcifications.  He was started on small dose aspirin and small dose Crestor.  He underwent treadmill Myoview test and was able to exercise for less than 5 minutes with severe exertional dyspnea.  He had significant ST changes with exercise.  Perfusion was normal but given his EKG changes there was concern for ischemia.  Cardiac cath was done which showed mild nonobstructive CAD, normal LVSF, mildly elevated LVEDP.  He was noted to be bradycardic and beta-blocker was decreased.  Today, the patient has been undergoing extensive thyroid work-up and medications changes. Triamterene and hydrochlorothiazide increased by PCP on 11/7. Appears he is not taking Valsartan. She ordered an echo and heart monitor for SOB and palpitations. He just turned in his heart monitor. He denies chest pain. He has SOB, worse with walking. Feels overall very  tired.He eats salt in his diet. Diet is bad, he has gained 40lbs in the last 3 months.   Echo showed LVEF 55-60%, mild LVH, normal RVSF, mild MR, borderline dilation of the ascending aorta, measuring 42mm, rhythm strip showed tachycardic runs during exam.   Past Medical History:  Diagnosis Date   High cholesterol    Hypertension    Meniere's disease    Thyroid disease     Past Surgical History:  Procedure Laterality Date   HERNIA REPAIR     LEFT HEART CATH AND CORONARY ANGIOGRAPHY N/A 01/10/2022   Procedure: LEFT HEART CATH AND CORONARY ANGIOGRAPHY;  Surgeon: Iran Ouch, MD;  Location: [facility] INVASIVE CV LAB;  Service: Cardiovascular;  Laterality: N/A;   VASECTOMY      Current Medications: Current Meds  Medication Sig   albuterol (VENTOLIN HFA) 108 (90 Base) MCG/ACT inhaler Inhale 1 puff into the lungs every 6 (six) hours as needed for wheezing or shortness of breath.   aspirin EC 81 MG tablet Take 81 mg by mouth daily. Swallow whole.   hydrochlorothiazide (HYDRODIURIL) 25 MG tablet Take 1 tablet (25 mg total) by mouth daily.   levothyroxine (SYNTHROID) 112 MCG tablet Take 1 tablet (112 mcg total) by mouth daily before breakfast.   metoprolol tartrate (LOPRESSOR) 50 MG tablet Take 0.5 tablets (25 mg total) by mouth 2 (two) times daily.   rosuvastatin (CRESTOR) 10 MG tablet TAKE ONE TABLET BY MOUTH ONE TIME DAILY   traZODone (DESYREL) 50 MG tablet Take 0.5 tablets (25 mg total) by mouth at bedtime as needed for sleep.   [DISCONTINUED] triamterene (DYRENIUM) 100  MG capsule Take 1 capsule (100 mg total) by mouth daily.   [DISCONTINUED] valsartan-hydrochlorothiazide (DIOVAN-HCT) 80-12.5 MG tablet Take 1 tablet by mouth daily.     Allergies:   Patient has no known allergies.   Social History   Socioeconomic History   Marital status: Married    Spouse name: Verlon Au   Number of children: 1   Years of education: Not on file   Highest education level: Not on file  Occupational  History   Not on file  Tobacco Use   Smoking status: Former    Current packs/day: 0.00    Average packs/day: 1 pack/day for 30.0 years (30.0 ttl pk-yrs)    Types: Cigarettes    Start date: 02/23/1987    Quit date: 02/22/2017    Years since quitting: 6.3    Passive exposure: Never   Smokeless tobacco: Never  Vaping Use   Vaping status: Never Used  Substance and Sexual Activity   Alcohol use: Not Currently   Drug use: Yes    Types: Marijuana   Sexual activity: Not Currently  Other Topics Concern   Not on file  Social History Narrative   Not on file   Social Determinants of Health   Financial Resource Strain: Medium Risk (02/02/2022)   Overall Financial Resource Strain (CARDIA)    Difficulty of Paying Living Expenses: Somewhat hard  Food Insecurity: No Food Insecurity (02/02/2022)   Hunger Vital Sign    Worried About Running Out of Food in the Last Year: Never true    Ran Out of Food in the Last Year: Never true  Transportation Needs: No Transportation Needs (02/02/2022)   PRAPARE - Administrator, Civil Service (Medical): No    Lack of Transportation (Non-Medical): No  Physical Activity: Sufficiently Active (02/02/2022)   Exercise Vital Sign    Days of Exercise per Week: 7 days    Minutes of Exercise per Session: 60 min  Stress: Stress Concern Present (02/02/2022)   [employer] of Occupational Health - Occupational Stress Questionnaire    Feeling of Stress : To some extent  Social Connections: Moderately Isolated (02/02/2022)   Social Connection and Isolation Panel [NHANES]    Frequency of Communication with Friends and Family: Three times a week    Frequency of Social Gatherings with Friends and Family: More than three times a week    Attends Religious Services: Never    Database administrator or Organizations: No    Attends Banker Meetings: Never    Marital Status: Married     Family History: The patient's family history includes Cancer in  his father and mother.  ROS:   Please see the history of present illness.     All other systems reviewed and are negative.  EKGs/Labs/Other Studies Reviewed:    The following studies were reviewed today:  Echo 05/2023   1. Left ventricular ejection fraction, by estimation, is 55 to 60%. Left  ventricular ejection fraction by 3D volume is 56 %. The left ventricle has  normal function. The left ventricle has no regional wall motion  abnormalities. There is mild left  ventricular hypertrophy. Left ventricular diastolic parameters are  indeterminate.   2. Right ventricular systolic function is normal. The right ventricular  size is mildly enlarged. Tricuspid regurgitation signal is inadequate for  assessing PA pressure.   3. The mitral valve is grossly normal. Mild mitral valve regurgitation.  No evidence of mitral stenosis.   4. The aortic valve  is grossly normal. There is mild calcification of the  aortic valve. Aortic valve regurgitation is not visualized. Aortic valve  sclerosis/calcification is present, without any evidence of aortic  stenosis.   5. There is borderline dilatation of the ascending aorta, measuring 42  mm.   6. The inferior vena cava is normal in size with greater than 50%  respiratory variability, suggesting right atrial pressure of 3 mmHg.   7. Rhythm strip during this exam demonstrates paroxysmal tachycardic runs  noted during exam.   LHC 12/2021    Prox LAD lesion is 30% stenosed.   Mid LAD lesion is 30% stenosed.   The left ventricular systolic function is normal.   LV end diastolic pressure is mildly elevated.   The left ventricular ejection fraction is 55-65% by visual estimate.   1.  Mild nonobstructive coronary artery disease. 2.  Normal LV systolic function. 3.  Bradycardia with heart rate in the high 40s and low 50s. 4.  Mildly elevated left ventricular end-diastolic pressure at 20 mmHg likely due to diastolic dysfunction.    Recommendations: Recommend aggressive medical therapy. Decrease the dose of metoprolol to 25 mg twice daily. Consider a small dose diuretic if dyspnea persists.    Echo 09/2021 1. Left ventricular ejection fraction, by estimation, is 65 to 70%. The  left ventricle has normal function. The left ventricle has no regional  wall motion abnormalities. The left ventricular internal cavity size was  mildly dilated. Left ventricular  diastolic parameters were normal.   2. Right ventricular systolic function is normal. The right ventricular  size is normal. There is mildly elevated pulmonary artery systolic  pressure. The estimated right ventricular systolic pressure is 38.0 mmHg.   3. The mitral valve is normal in structure. Trivial mitral valve  regurgitation.   4. The aortic valve is tricuspid. Aortic valve regurgitation is not  visualized. Aortic valve sclerosis/calcification is present, without any  evidence of aortic stenosis.   5. Aortic dilatation noted. There is dilatation of the ascending aorta,  measuring 40 mm.   6. The inferior vena cava is normal in size with greater than 50%  respiratory variability, suggesting right atrial pressure of 3 mmHg.   EKG:  EKG is ordered today.  The ekg ordered today demonstrates SB 55bpm, nonspecific T wave changes  Recent Labs: 02/14/2023: ALT 18; BUN 18; Creatinine, Ser 1.25; Hemoglobin 13.9; Platelets 178; Potassium 4.0; Sodium 141 06/06/2023: TSH 2.830  Recent Lipid Panel    Component Value Date/Time   CHOL 140 02/14/2023 0903   TRIG 96 02/14/2023 0903   HDL 46 02/14/2023 0903   CHOLHDL 3.0 02/14/2023 0903   CHOLHDL 3.6 10/17/2021 0321   VLDL 15 10/17/2021 0321   LDLCALC 76 02/14/2023 0903     Physical Exam:    VS:  BP (!) 154/107 (BP Location: Left Arm, Patient Position: Sitting, Cuff Size: Large)   Pulse (!) 55   Ht 5\' 9"  (1.753 m)   Wt 266 lb 12.8 oz (121 kg)   SpO2 97%   BMI 39.40 kg/m     Wt Readings from Last 3  Encounters:  06/15/23 266 lb 12.8 oz (121 kg)  06/12/23 264 lb 3.2 oz (119.8 kg)  06/01/23 263 lb (119.3 kg)     GEN:  Well nourished, well developed in no acute distress HEENT: Normal NECK: No JVD; No carotid bruits LYMPHATICS: No lymphadenopathy CARDIAC: bradycardic, normal RR, no murmurs, rubs, gallops RESPIRATORY:  Clear to auscultation without rales, wheezing or rhonchi  ABDOMEN: Soft, non-tender, non-distended MUSCULOSKELETAL:  No edema; No deformity  SKIN: Warm and dry NEUROLOGIC:  Alert and oriented x 3 PSYCHIATRIC:  Normal affect   ASSESSMENT:    1. DOE (dyspnea on exertion)   2. Sleep disorder   3. Coronary artery disease involving native coronary artery of native heart without angina pectoris   4. Medication management   5. Hyperthyroidism without crisis   6. Postablative hypothyroidism   7. Essential hypertension   8. Ascending aorta dilation (HCC)    PLAN:    In order of problems listed above:  DOE/fatigue/sleep disorder CAD 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 He denies chest pain, but has DOE and generalized fatigue. This may be multifactorial given thyroid issues, general deconditioning, obesity (with reported weight gain), HFpEF and possible lung component. Recent echo showed LVEF 55-60%, mild LVH, normal RV, mild MR, dilation of ascending aorta measuring 42mm. Heart monitor is pending. Patient is euvolemic on exam. I will check CBC and CMET. Continue Aspirin 81mg  dialy, Lopressor 25mg BID, and Crestor 10mg  daily. I will refer to pulmonology to consider PFTs and sleep study. He continues to be followed by endocrinology for thyroid issues.  Hyperthyroidism s/p ablation 08/2022  Post-ablative hypothyroidism Patient is currently on Synthroid followed closely by Endocrinology.   Palpitations During echo, paroxysmal tachycardia noted. Heart monitor pending.  HTN BP has been difficult to control the last few months. Today BP is 154/107. Patient saw PCP on  11/7 who increased hydrochlorothiazide and triamterene, however he has not picked this up. Appears he is no longer on Valsartan. Continue Lopressor 25mg BID, cannot increase due to bradycardia. We will check his medications with the pharmacy and re-visit this at follow-up. Recommended heart healthy diet and low salt.   Mildly dilated ascending aorta Echo showed ascending aortic dilation 42mm. In 2023 it was 40mm. Can check focused CT aorta study at follow-up.   Disposition: Follow up in 1 month(s) with MD/APP    Signed, Orlie Cundari David Stall, PA-C  06/15/2023 11:40 AM    [facility]

## 2023-06-16 LAB — COMPREHENSIVE METABOLIC PANEL
ALT: 14 [IU]/L (ref 0–44)
AST: 17 [IU]/L (ref 0–40)
Albumin: 4.3 g/dL (ref 3.9–4.9)
Alkaline Phosphatase: 65 [IU]/L (ref 44–121)
BUN/Creatinine Ratio: 8 — ABNORMAL LOW (ref 10–24)
BUN: 10 mg/dL (ref 8–27)
Bilirubin Total: 0.3 mg/dL (ref 0.0–1.2)
CO2: 24 mmol/L (ref 20–29)
Calcium: 8.9 mg/dL (ref 8.6–10.2)
Chloride: 101 mmol/L (ref 96–106)
Creatinine, Ser: 1.18 mg/dL (ref 0.76–1.27)
Globulin, Total: 2.7 g/dL (ref 1.5–4.5)
Glucose: 86 mg/dL (ref 70–99)
Potassium: 4.3 mmol/L (ref 3.5–5.2)
Sodium: 141 mmol/L (ref 134–144)
Total Protein: 7 g/dL (ref 6.0–8.5)
eGFR: 69 mL/min/{1.73_m2} (ref >59)

## 2023-06-16 LAB — CBC
Hematocrit: 42.4 % (ref 37.5–51.0)
Hemoglobin: 13.6 g/dL (ref 13.0–17.7)
MCH: 29.1 pg (ref 26.6–33.0)
MCHC: 32.1 g/dL (ref 31.5–35.7)
MCV: 91 fL (ref 79–97)
Platelets: 231 10*3/uL (ref 150–450)
RBC: 4.68 x10E6/uL (ref 4.14–5.80)
RDW: 13.2 % (ref 11.6–15.4)
WBC: 8.8 10*3/uL (ref 3.4–10.8)

## 2023-06-26 MED ORDER — LISINOPRIL 40 MG PO TABS
40.0000 mg | ORAL_TABLET | Freq: Every day | ORAL | 1 refills | Status: DC
Start: 2023-06-26 — End: 2023-12-27

## 2023-06-26 MED ORDER — AMLODIPINE BESYLATE 5 MG PO TABS
5.0000 mg | ORAL_TABLET | Freq: Two times a day (BID) | ORAL | 1 refills | Status: DC
Start: 2023-06-26 — End: 2023-12-27

## 2023-06-26 NOTE — Addendum Note (Signed)
Addended by: Saralyn Pilar on: 06/26/2023 01:13 PM   Modules accepted: Orders

## 2023-06-28 ENCOUNTER — Other Ambulatory Visit: Payer: Self-pay | Admitting: Cardiology

## 2023-06-28 DIAGNOSIS — I471 Supraventricular tachycardia, unspecified: Secondary | ICD-10-CM

## 2023-06-29 ENCOUNTER — Ambulatory Visit (INDEPENDENT_AMBULATORY_CARE_PROVIDER_SITE_OTHER): Payer: Medicare Other | Admitting: Family Medicine

## 2023-06-29 VITALS — BP 146/87 | HR 60 | Resp 18 | Ht 69.0 in | Wt 270.0 lb

## 2023-06-29 DIAGNOSIS — I10 Essential (primary) hypertension: Secondary | ICD-10-CM | POA: Diagnosis not present

## 2023-06-29 DIAGNOSIS — R058 Other specified cough: Secondary | ICD-10-CM

## 2023-06-29 DIAGNOSIS — R0600 Dyspnea, unspecified: Secondary | ICD-10-CM

## 2023-06-29 MED ORDER — GUAIFENESIN ER 600 MG PO TB12: 600.0000 mg | ORAL_TABLET | Freq: Two times a day (BID) | ORAL | 0 refills | Status: AC

## 2023-06-29 NOTE — Patient Instructions (Addendum)
Continue your current medications and keep a blood pressure log for the next week.  Please send me a picture on MyChart next week!  Start taking the guaifenesin (Mucinex) that I sent in for you twice a day.  Be drinking a lot of water to help flush out the mucus that is in your lungs.  Please let me know if the coughing/mucus gets worse, you develop a fever, or it gets more difficult to breathe.

## 2023-06-29 NOTE — Assessment & Plan Note (Signed)
BP goal <130/80. Continue hydrochlorothiazide 25 mg daily, lisinopril 40 mg daily, amlodipine 5 mg twice daily, metoprolol 25 mg twice daily.  Continue ambulatory blood pressure log, send to PCP in 1 week.  If blood pressure remains elevated, further adjust medication regimen.  Will coordinate care with cardiology, next cardiology follow-up on 08/01/2023.

## 2023-06-29 NOTE — Progress Notes (Signed)
Established Patient Office Visit  Subjective   Patient ID: James Andersen, male    DOB: 11/18/1960  Age: 62 y.o. MRN: [id number]  Chief Complaint  Patient presents with   Hypertension   Shortness of Breath    HPI Sinan Shively is a 62 y.o. male presenting today for follow up of hypertension and dyspnea.  He also notes that he has had a productive cough for about a week, he has not taken any over-the-counter medications thus far.  At last appointment 1 month ago, patient complained of shortness of breath at rest, and with activity, even walking short distances .  Symptoms included difficulty breathing, dyspnea on exertion, and dyspnea when laying down. Symptoms began several weeks ago, fairly stable since that time.  Patient denied constant cough, cough after eating, drainage from nose, dry cough, frequent throat clearing, morning cough, post nasal drip, shortness of breath, sputum production, tightness in chest, unresolving pneumonia, wheezing, and chest pain. Denied edema of extremities.  Denied associated symptoms including chills,  ear congestion, ear pain, eye irritation, facial pain, fever, headache, myalgias, nasal congestion, rhinorrhea , sneezing, sore throat, sweats, weight loss, and wheezing. Patient had not had recent travel.  At the time, he does endorse increased appetite.  Blood pressure was elevated at appointment, and he kept a blood pressure log at home with continually elevated blood pressure.  Echocardiogram demonstrated decrease in EF to 55-60% compared to 65-70% on 09/26/2021. Chest x-ray negative for cardiopulmonary processes.  Long-term monitor demonstrated sustained SVT as well as 2 pauses, recommended cardiology follow-up which is scheduled for 08/01/2023.  At previous appointment with cardiology on 06/15/2023, had not yet increased hydrochlorothiazide and triamterene.  Patient sent message to PCP regarding continuously elevated blood pressures even with the increase in  hydrochlorothiazide and triamterene, subsequently changed medication routine on 06/27/2023 to hydrochlorothiazide 25 mg daily, lisinopril 40 mg daily, amlodipine 5 mg twice daily, metoprolol 25 mg twice daily.  He started this routine yesterday and reports feeling much better, though blood pressures when checked at home remained elevated in the 150s systolic.  He has noticed that he tends to have a racing heart more often shortly after taking his levothyroxine and plans on contacting his endocrinologist with this update today.  Outpatient Medications Prior to Visit  Medication Sig   albuterol (VENTOLIN HFA) 108 (90 Base) MCG/ACT inhaler Inhale 1 puff into the lungs every 6 (six) hours as needed for wheezing or shortness of breath.   amLODipine (NORVASC) 5 MG tablet Take 1 tablet (5 mg total) by mouth 2 (two) times daily.   aspirin EC 81 MG tablet Take 81 mg by mouth daily. Swallow whole.   hydrochlorothiazide (HYDRODIURIL) 25 MG tablet Take 1 tablet (25 mg total) by mouth daily.   levothyroxine (SYNTHROID) 112 MCG tablet Take 1 tablet (112 mcg total) by mouth daily before breakfast.   lisinopril (ZESTRIL) 40 MG tablet Take 1 tablet (40 mg total) by mouth daily.   metoprolol tartrate (LOPRESSOR) 50 MG tablet Take 0.5 tablets (25 mg total) by mouth 2 (two) times daily.   rosuvastatin (CRESTOR) 10 MG tablet TAKE ONE TABLET BY MOUTH ONE TIME DAILY   traZODone (DESYREL) 50 MG tablet Take 0.5 tablets (25 mg total) by mouth at bedtime as needed for sleep.   No facility-administered medications prior to visit.    ROS Negative unless otherwise noted in HPI   Objective:     BP (!) 156/95 (BP Location: Right Arm, Patient Position: Sitting, Cuff Size:  Large)   Pulse 60   Resp 18   Ht 5\' 9"  (1.753 m)   Wt 270 lb (122.5 kg)   SpO2 95%   BMI 39.87 kg/m   Physical Exam Constitutional:      General: He is not in acute distress.    Appearance: Normal appearance.  HENT:     Head: Normocephalic and  atraumatic.  Cardiovascular:     Rate and Rhythm: Normal rate and regular rhythm.     Heart sounds: Normal heart sounds. No murmur heard.    No friction rub. No gallop.  Pulmonary:     Effort: Pulmonary effort is normal. No respiratory distress.     Breath sounds: Examination of the left-upper field reveals wheezing. Examination of the right-lower field reveals wheezing. Wheezing present. No rhonchi or rales.  Skin:    General: Skin is warm and dry.  Neurological:     Mental Status: He is alert and oriented to person, place, and time.  Psychiatric:        Mood and Affect: Mood normal.     Assessment & Plan:  Essential hypertension Assessment & Plan: BP goal <130/80. Continue hydrochlorothiazide 25 mg daily, lisinopril 40 mg daily, amlodipine 5 mg twice daily, metoprolol 25 mg twice daily.  Continue ambulatory blood pressure log, send to PCP in 1 week.  If blood pressure remains elevated, further adjust medication regimen.  Will coordinate care with cardiology, next cardiology follow-up on 08/01/2023.   Dyspnea, unspecified type  Productive cough -     guaiFENesin ER; Take 1 tablet (600 mg total) by mouth 2 (two) times daily.  Dispense: 28 tablet; Refill: 0  Continue monitoring dyspnea, PCP sending message to cardiologist and endocrinologist to coordinate care and ensure continuity of care.  Recommend guaifenesin for 14 days for productive cough with adequate hydration.  Return in about 4 months (around 10/28/2023) for follow-up for HTN, HLD, dyspnea, care coordination.    Melida Quitter, PA

## 2023-07-04 ENCOUNTER — Encounter: Payer: Self-pay | Admitting: Nurse Practitioner

## 2023-07-05 ENCOUNTER — Encounter: Payer: Self-pay | Admitting: Family Medicine

## 2023-07-05 MED ORDER — SYNTHROID 100 MCG PO TABS: 100.0000 ug | ORAL_TABLET | Freq: Every day | ORAL | 3 refills | Status: DC

## 2023-07-06 MED ORDER — SYNTHROID 100 MCG PO TABS: 100.0000 ug | ORAL_TABLET | Freq: Every day | ORAL | 3 refills | Status: DC

## 2023-07-06 NOTE — Addendum Note (Signed)
Addended by: Dani Gobble on: 07/06/2023 07:11 AM   Modules accepted: Orders

## 2023-07-07 ENCOUNTER — Encounter: Payer: Self-pay | Admitting: Family Medicine

## 2023-07-17 ENCOUNTER — Encounter: Payer: Self-pay | Admitting: Nurse Practitioner

## 2023-07-24 ENCOUNTER — Ambulatory Visit: Payer: Medicare Other | Admitting: Medical

## 2023-08-01 ENCOUNTER — Ambulatory Visit: Payer: Medicare Other | Admitting: Medical

## 2023-08-01 NOTE — Progress Notes (Deleted)
 Cardiology Office Note:    Date:  08/01/2023   ID:  James Andersen, DOB 1961/05/08, MRN [id number]  PCP:  Wallace Joesph LABOR, PA  [facility] Cardiologist:  Deatrice Cage, MD  [facility] Electrophysiologist:  None   Referring MD: Wallace Joesph LABOR, PA   Chief Complaint: ***  History of Present Illness:    James Andersen is a 63 y.o. male with a hx of mild nonobstructive CAD, palpitations in the setting of hyperthyroidism, hypertension, hyperlipidemia, Mnire's disease, previous tobacco use, and obesity who presents for follow-up.   The patient went hospitalized in March 2023 with fatigue, dyspnea on exertion, feeling jittery and palpitations.  Troponin was mildly elevated to 23 and BNP 70.  TSH was very low.  Echo showed normal LV function, mild pulmonary hypertension, mildly dilated ascending aorta 40 mm.  It was felt symptoms were related to hyperthyroidism.  He was treated with methimazole .  CT scan for lung cancer screening showed no masses but was found to have extensive three-vessel coronary artery calcifications and moderate aortic calcifications.  He was started on small dose aspirin  and small dose Crestor .  He underwent treadmill Myoview  test and was able to exercise for less than 5 minutes with severe exertional dyspnea.  He had significant ST changes with exercise.  Perfusion was normal but given his EKG changes there was concern for ischemia.  Cardiac cath was done which showed mild nonobstructive CAD, normal LVSF, mildly elevated LVEDP.  He was noted to be bradycardic and beta-blocker was decreased.   The patient was last seen in November 2024 and was undergoing extensive thyroid  workup and medication changes.  Echo for palpitations was ordered by PCP.  This showed normal LVEF 55 to 60%, mild LVH, normal RV SF, mild MR, borderline dilation of the ascending aorta measuring 42 mm.  Rhythm strip showed tachycardia runs during exam.  Patient reported he overall felt tired and  reported weight gain of 40 pounds suspected due to bad diet.  Heart monitor for palpitations showed normal sinus rhythm with average heart rate of 69, frequent episodes of nonsustaine SVT, longest lasting 8 minutes, no A-fib, occasional ventricular ectopy at 3.6%, 2 pauses, longest lasting 3.6 seconds.  Today, Hydrochlorothiazide  25 mg daily, lisinopril  40 mg daily, amlodipine  5 mg twice a day, metoprolol  25 mg twice a day.  EP referral  Past Medical History:  Diagnosis Date   High cholesterol    Hypertension    Meniere's disease    Thyroid  disease     Past Surgical History:  Procedure Laterality Date   HERNIA REPAIR     LEFT HEART CATH AND CORONARY ANGIOGRAPHY N/A 01/10/2022   Procedure: LEFT HEART CATH AND CORONARY ANGIOGRAPHY;  Surgeon: Cage Deatrice LABOR, MD;  Location: [facility] INVASIVE CV LAB;  Service: Cardiovascular;  Laterality: N/A;   VASECTOMY      Current Medications: No outpatient medications have been marked as taking for the 08/01/23 encounter (Appointment) with Franchester, Rachel Samples H, PA-C.     Allergies:   Patient has no known allergies.   Social History   Socioeconomic History   Marital status: Married    Spouse name: James Andersen   Number of children: 1   Years of education: Not on file   Highest education level: Bachelor's degree (e.g., BA, AB, BS)  Occupational History   Not on file  Tobacco Use   Smoking status: Former    Current packs/day: 0.00    Average packs/day: 1 pack/day for 30.0 years (30.0 ttl pk-yrs)  Types: Cigarettes    Start date: 02/23/1987    Quit date: 02/22/2017    Years since quitting: 6.4    Passive exposure: Never   Smokeless tobacco: Never  Vaping Use   Vaping status: Never Used  Substance and Sexual Activity   Alcohol use: Not Currently   Drug use: Yes    Types: Marijuana   Sexual activity: Not Currently  Other Topics Concern   Not on file  Social History Narrative   Not on file   Social Drivers of Health   Financial Resource  Strain: Low Risk  (06/29/2023)   Overall Financial Resource Strain (CARDIA)    Difficulty of Paying Living Expenses: Not very hard  Food Insecurity: Food Insecurity Present (06/29/2023)   Hunger Vital Sign    Worried About Running Out of Food in the Last Year: Sometimes true    Ran Out of Food in the Last Year: Never true  Transportation Needs: No Transportation Needs (06/29/2023)   PRAPARE - Administrator, Civil Service (Medical): No    Lack of Transportation (Non-Medical): No  Physical Activity: Unknown (06/29/2023)   Exercise Vital Sign    Days of Exercise per Week: 0 days    Minutes of Exercise per Session: Not on file  Stress: Stress Concern Present (06/29/2023)   [employer] of Occupational Health - Occupational Stress Questionnaire    Feeling of Stress : To some extent  Social Connections: Moderately Isolated (06/29/2023)   Social Connection and Isolation Panel [NHANES]    Frequency of Communication with Friends and Family: More than three times a week    Frequency of Social Gatherings with Friends and Family: Three times a week    Attends Religious Services: Never    Active Member of Clubs or Organizations: No    Attends Engineer, Structural: Not on file    Marital Status: Married     Family History: The patient's ***family history includes Cancer in his father and mother.  ROS:   Please see the history of present illness.    *** All other systems reviewed and are negative.  EKGs/Labs/Other Studies Reviewed:    The following studies were reviewed today: ***  EKG:  EKG is *** ordered today.  The ekg ordered today demonstrates ***  Recent Labs: 06/06/2023: TSH 2.830 06/15/2023: ALT 14; BUN 10; Creatinine, Ser 1.18; Hemoglobin 13.6; Platelets 231; Potassium 4.3; Sodium 141  Recent Lipid Panel    Component Value Date/Time   CHOL 140 02/14/2023 0903   TRIG 96 02/14/2023 0903   HDL 46 02/14/2023 0903   CHOLHDL 3.0 02/14/2023 0903   CHOLHDL  3.6 10/17/2021 0321   VLDL 15 10/17/2021 0321   LDLCALC 76 02/14/2023 0903     Risk Assessment/Calculations:   {Does this patient have ATRIAL FIBRILLATION?:[phone]}   Physical Exam:    VS:  There were no vitals taken for this visit.    Wt Readings from Last 3 Encounters:  06/29/23 270 lb (122.5 kg)  06/15/23 266 lb 12.8 oz (121 kg)  06/12/23 264 lb 3.2 oz (119.8 kg)     GEN: *** Well nourished, well developed in no acute distress HEENT: Normal NECK: No JVD; No carotid bruits LYMPHATICS: No lymphadenopathy CARDIAC: ***RRR, no murmurs, rubs, gallops RESPIRATORY:  Clear to auscultation without rales, wheezing or rhonchi  ABDOMEN: Soft, non-tender, non-distended MUSCULOSKELETAL:  No edema; No deformity  SKIN: Warm and dry NEUROLOGIC:  Alert and oriented x 3 PSYCHIATRIC:  Normal affect  ASSESSMENT:    No diagnosis found. PLAN:    In order of problems listed above:  ***  Disposition: Follow up {follow up:15908} with ***   Shared Decision Making/Informed Consent   {Are you ordering a CV Procedure (e.g. stress test, cath, DCCV, TEE, etc)?   Press F2        :[id number]}    Signed, Shaden Higley VEAR Franchester RIGGERS  08/01/2023 7:43 AM    [facility]

## 2023-08-07 ENCOUNTER — Other Ambulatory Visit: Payer: Self-pay | Admitting: Family Medicine

## 2023-08-07 ENCOUNTER — Institutional Professional Consult (permissible substitution): Payer: Medicare Other | Admitting: Internal Medicine

## 2023-08-07 DIAGNOSIS — E785 Hyperlipidemia, unspecified: Secondary | ICD-10-CM

## 2023-08-31 ENCOUNTER — Encounter: Payer: Self-pay | Admitting: Family Medicine

## 2023-10-07 IMAGING — CR DG CHEST 2V
2 series · 2 of 2 positions shown · non-contrast
Comparison: None.

CLINICAL DATA: 60-year-old male with palpitations and shortness of
breath.

EXAM:
CHEST - 2 VIEW

[chest lat]
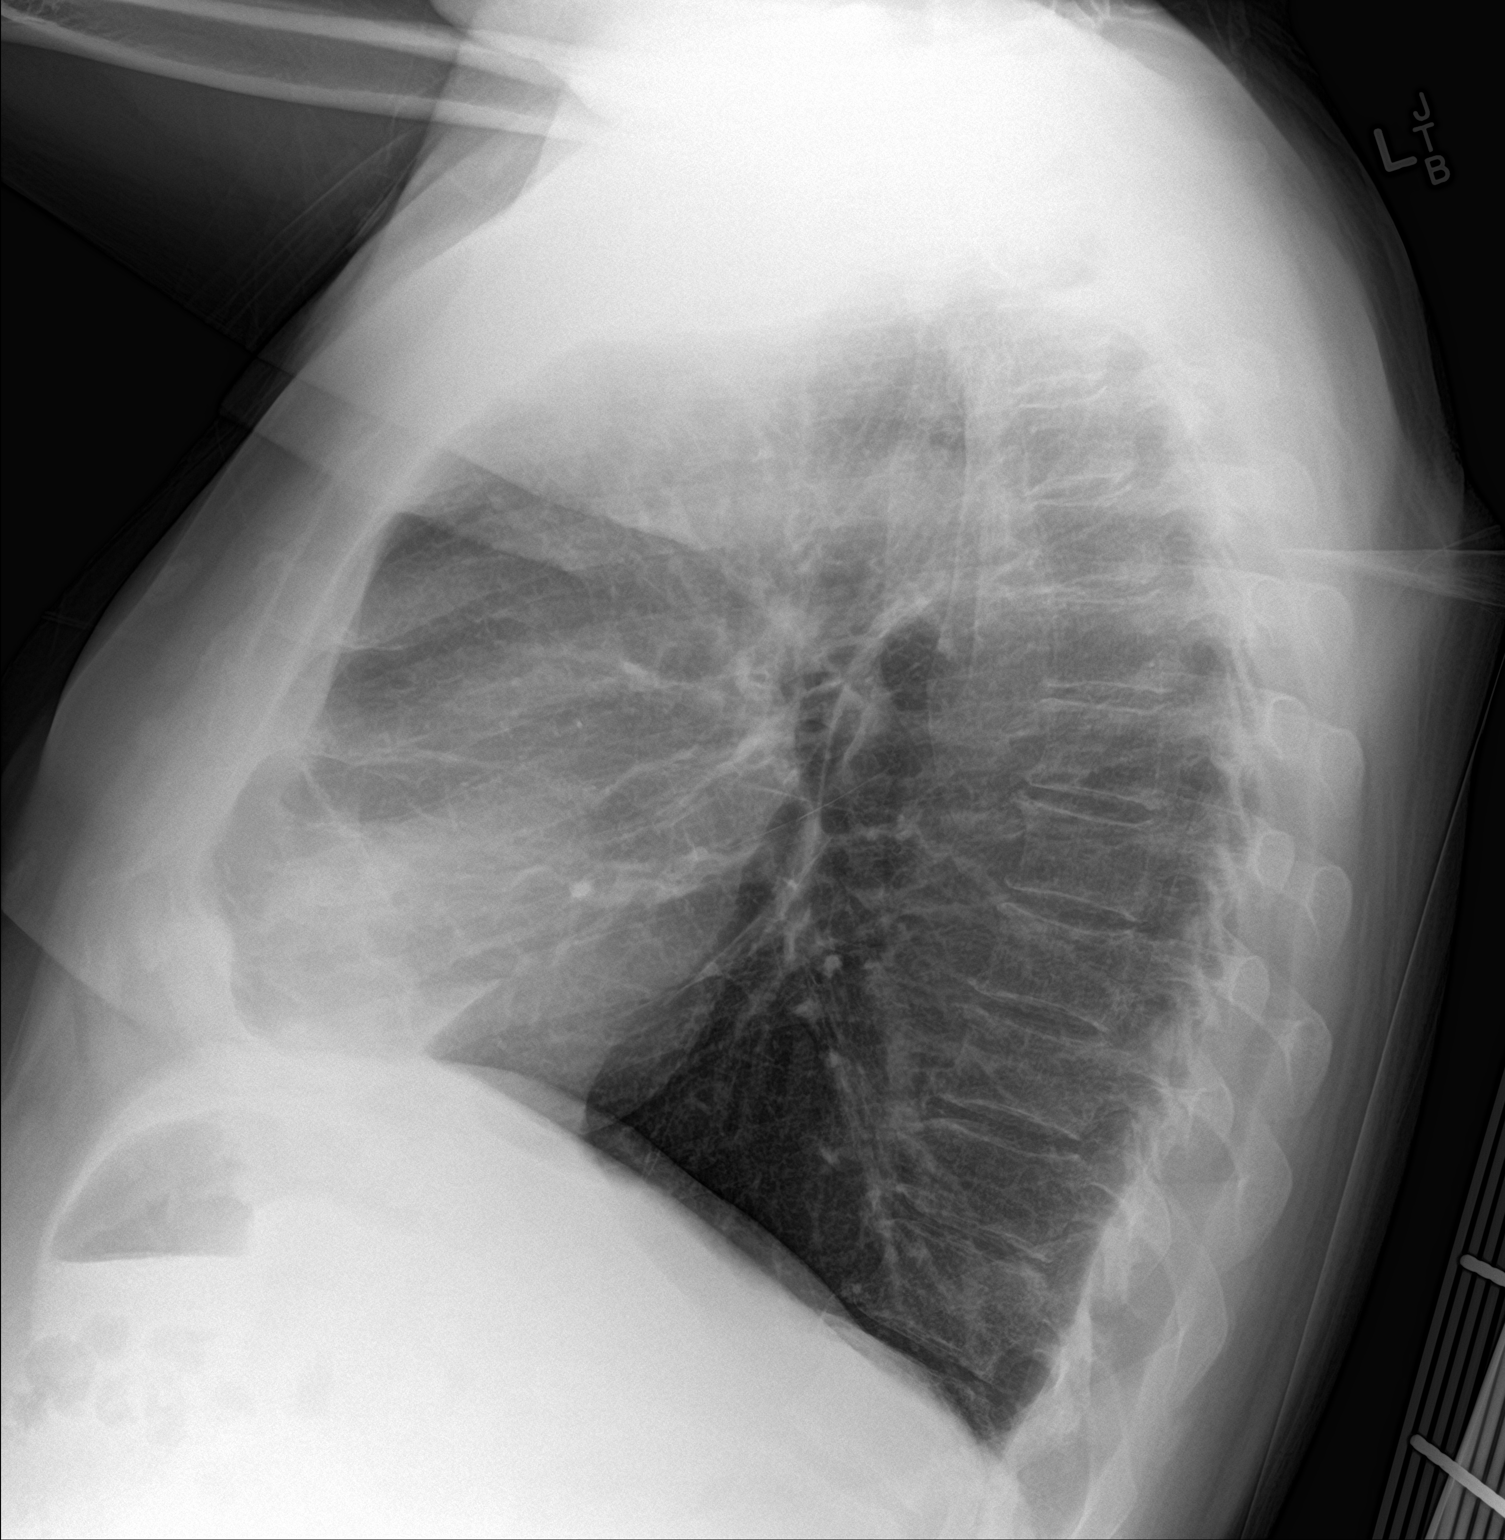

[chest ap]
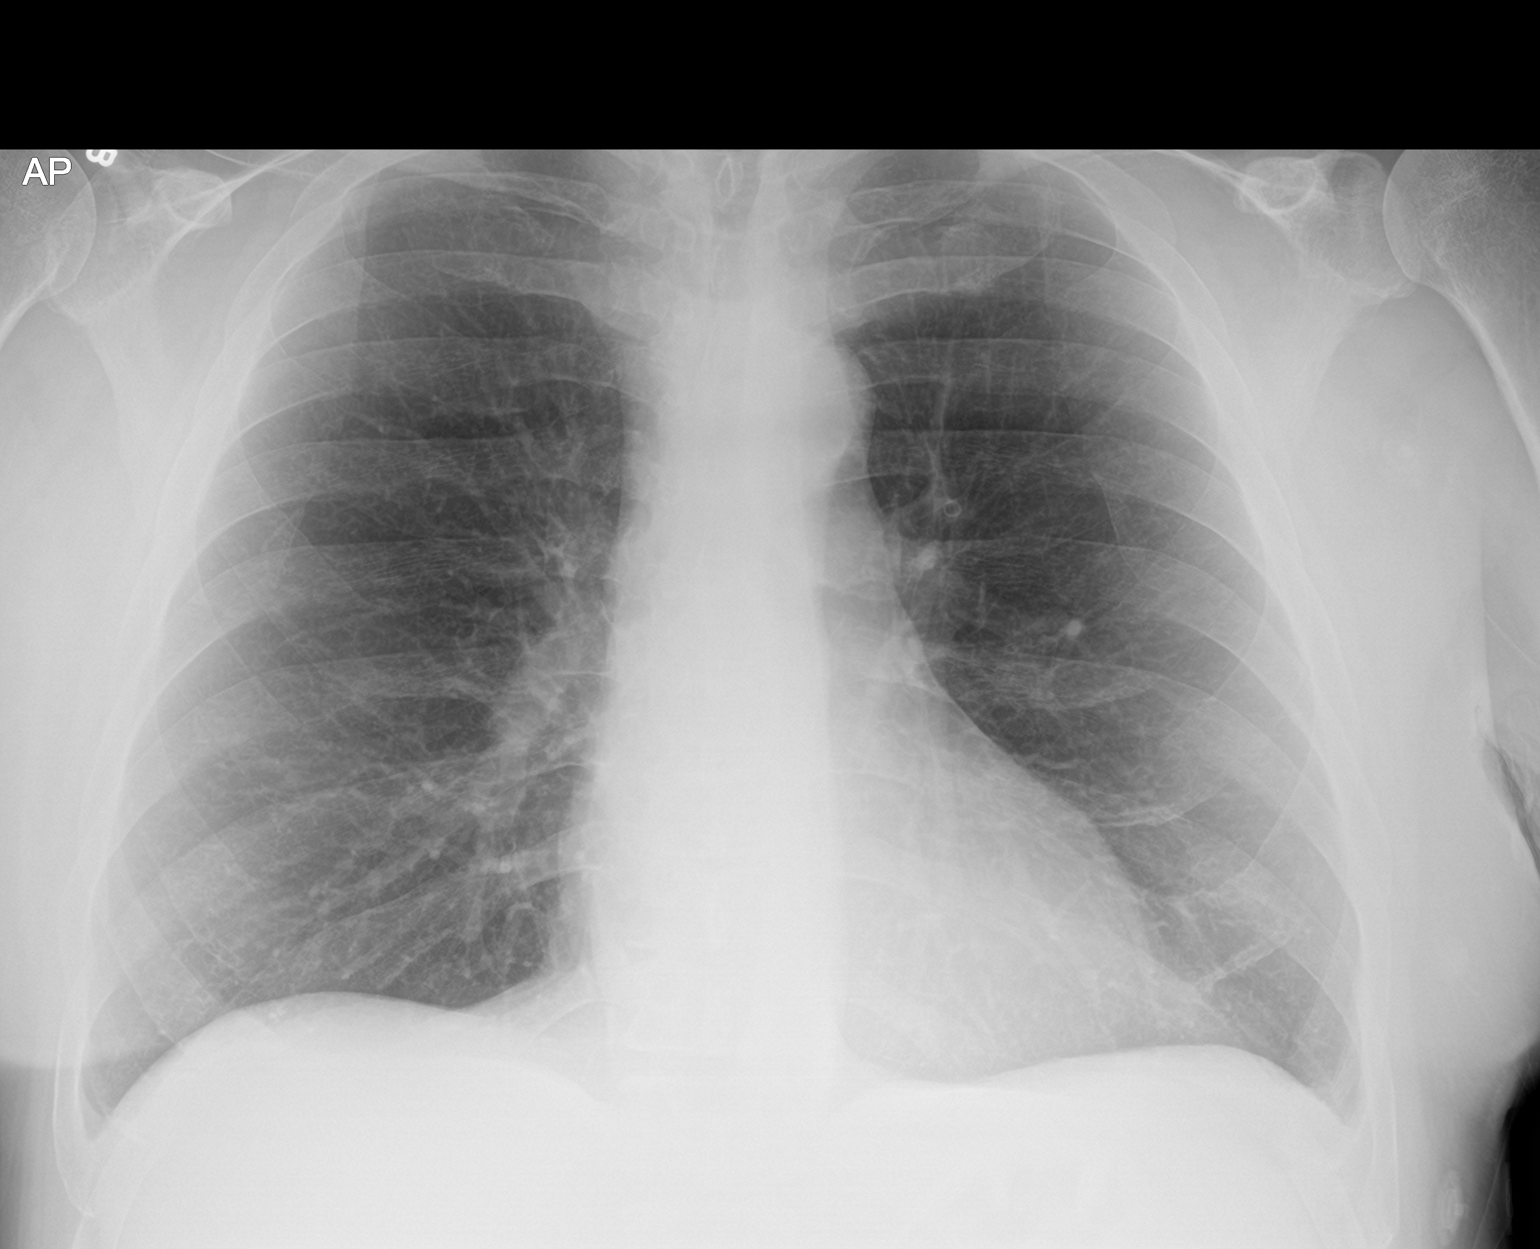

[2 of 2 positions shown; findings below may reference images not displayed]

FINDINGS: The mediastinal contours are within normal limits. No cardiomegaly.
The lungs are clear bilaterally without evidence of focal
consolidation, pleural effusion, or pneumothorax. No acute osseous
abnormality.
IMPRESSION: No acute cardiopulmonary process.

## 2023-10-08 IMAGING — US US THYROID
1 series · 14 of 25 positions shown · non-contrast
Comparison: None.

CLINICAL DATA: Hyperthyroidism

EXAM:
THYROID ULTRASOUND
TECHNIQUE: Ultrasound examination of the thyroid gland and adjacent soft
tissues was performed.

[Series 1: us thyroid · 29 acquisitions, 14 frames shown]
[im 1/29]
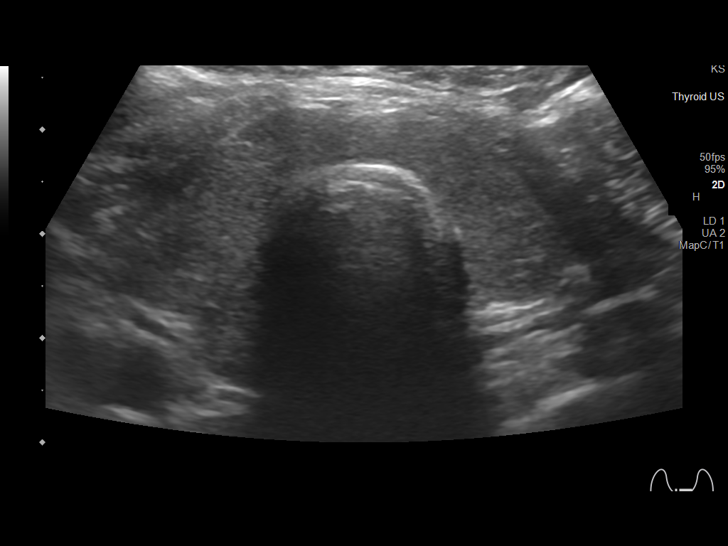
[im 3/29]
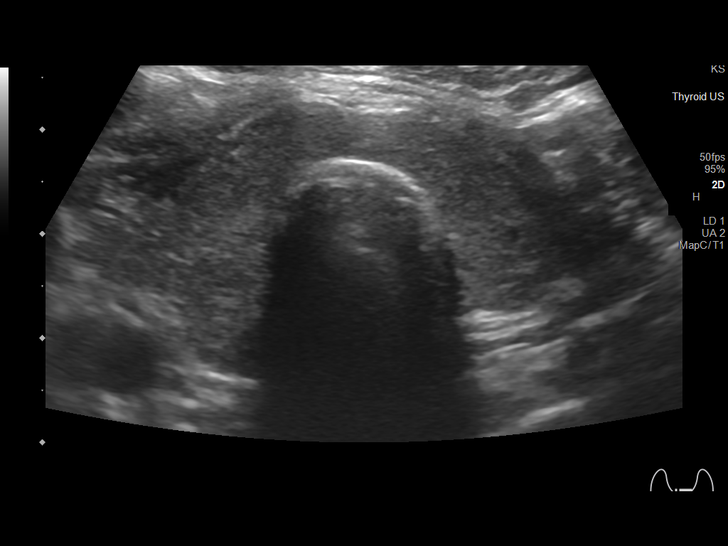
[im 5/29]
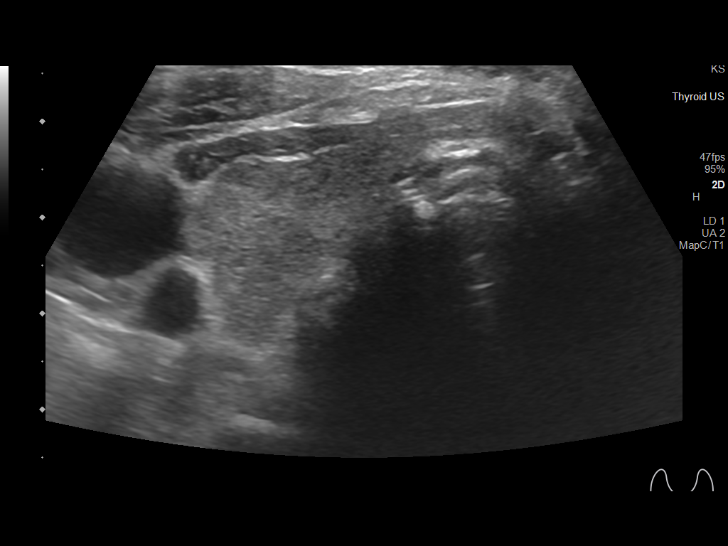
[im 8/29]
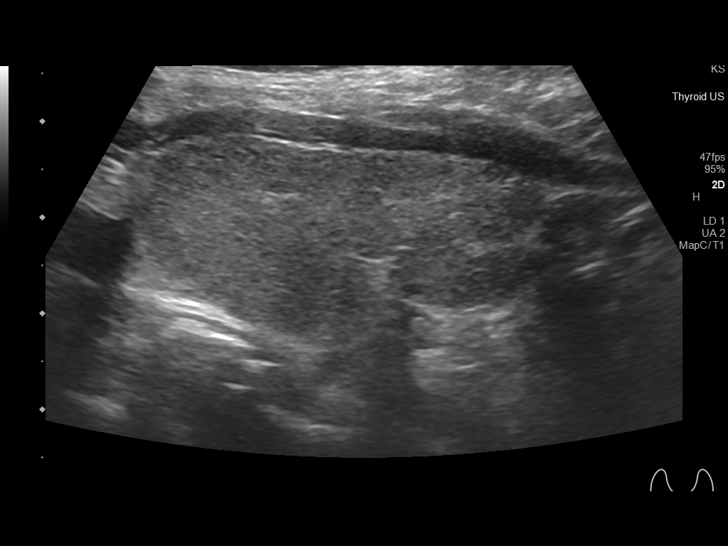
[im 10/29]
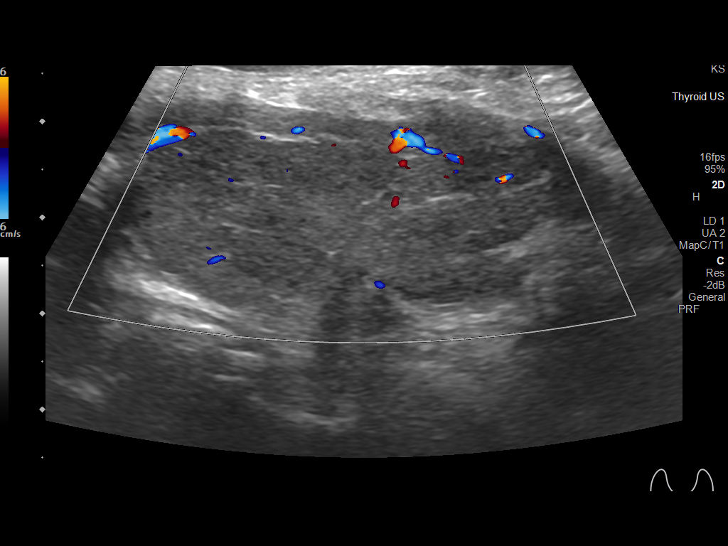
[im 11/29]
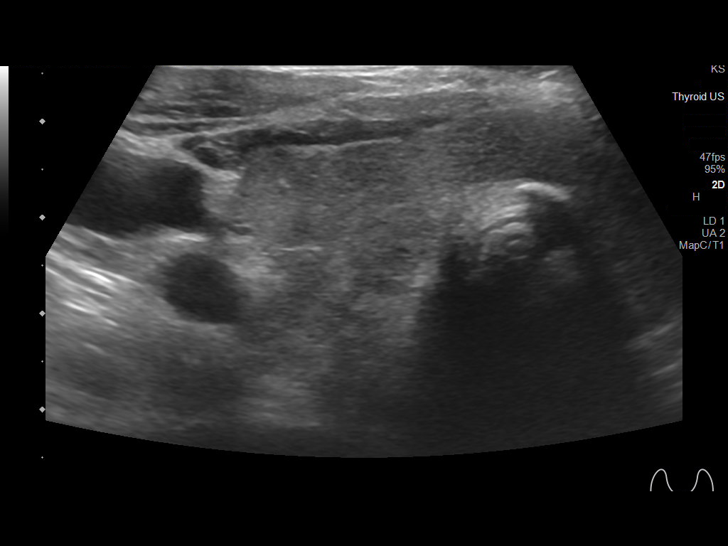
[im 13/29]
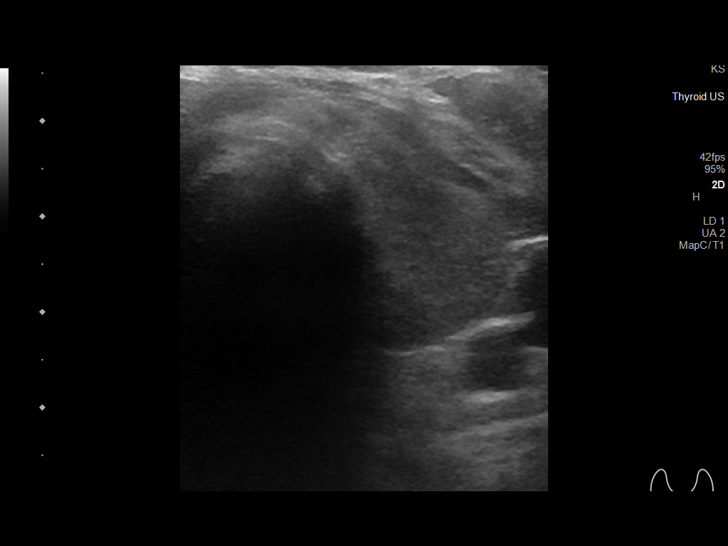
[im 16/29]
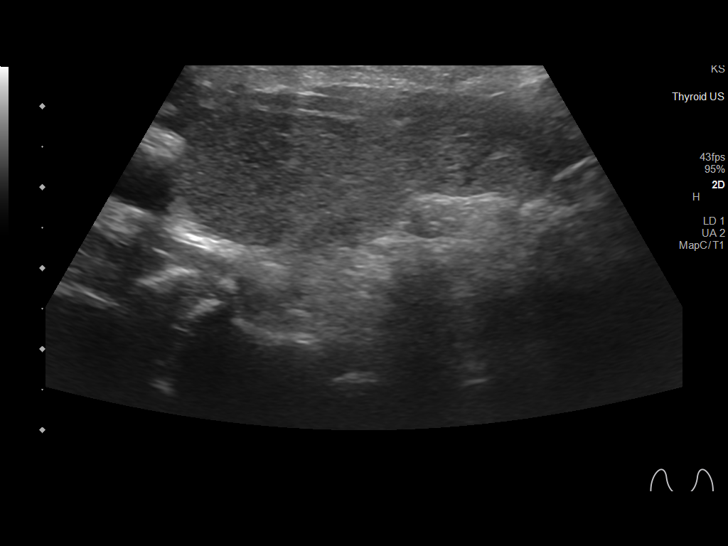
[im 18/29]
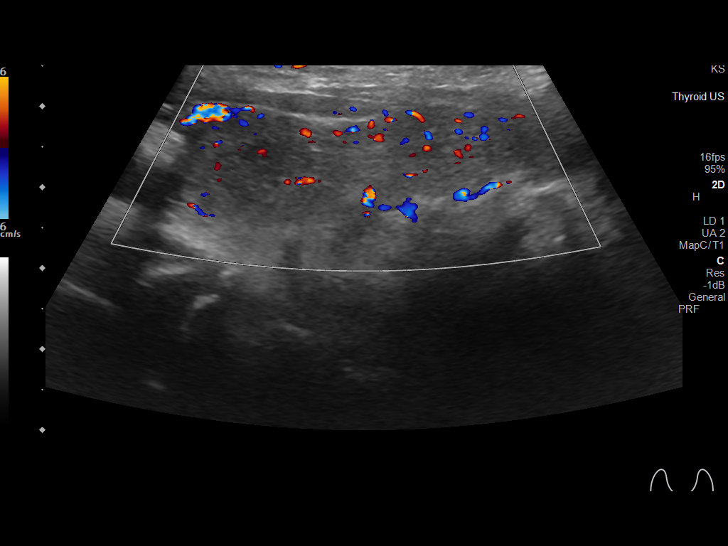
[im 19/29]
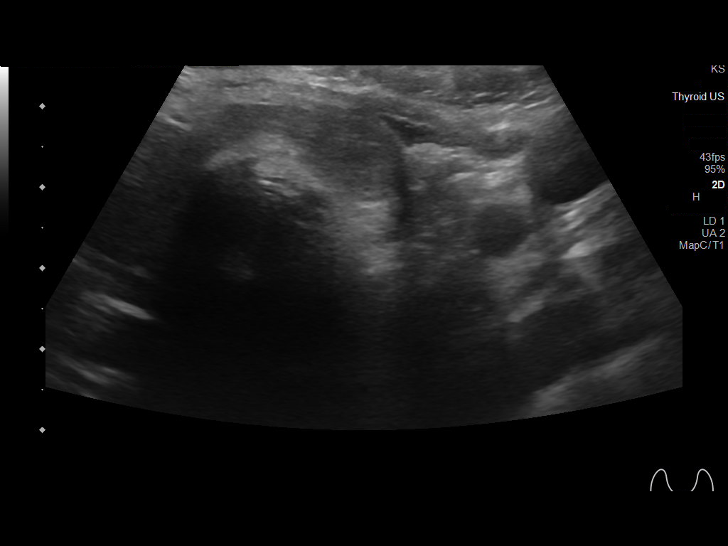
[im 22/29]
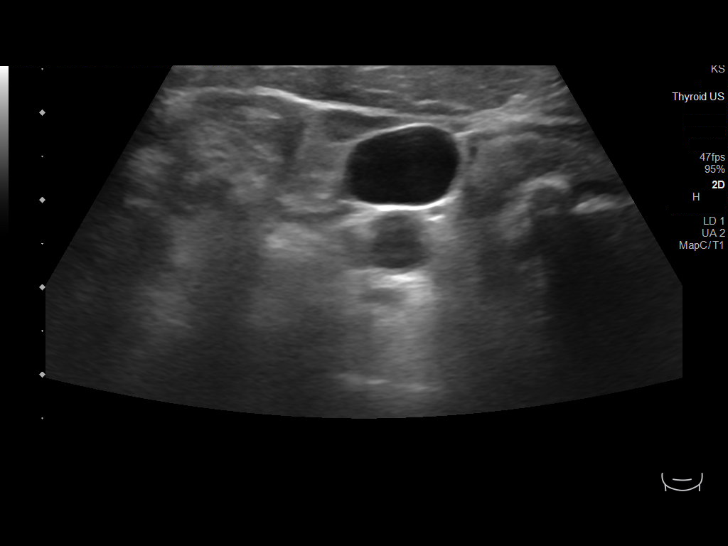
[im 24/29]
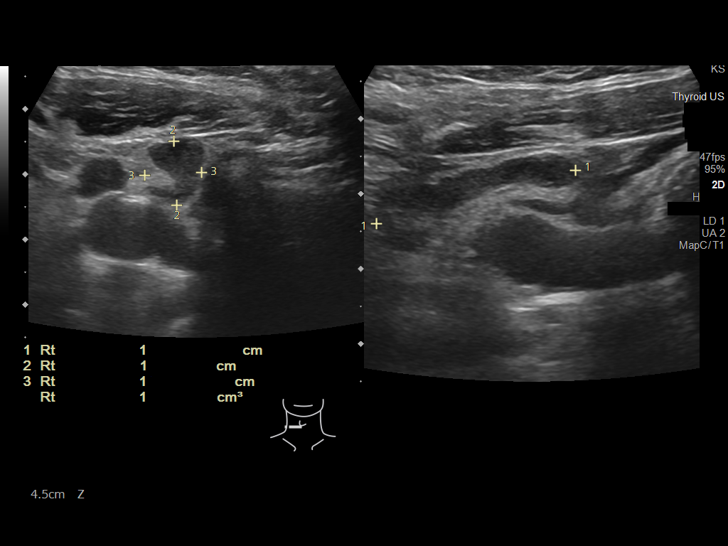
[im 26/29]
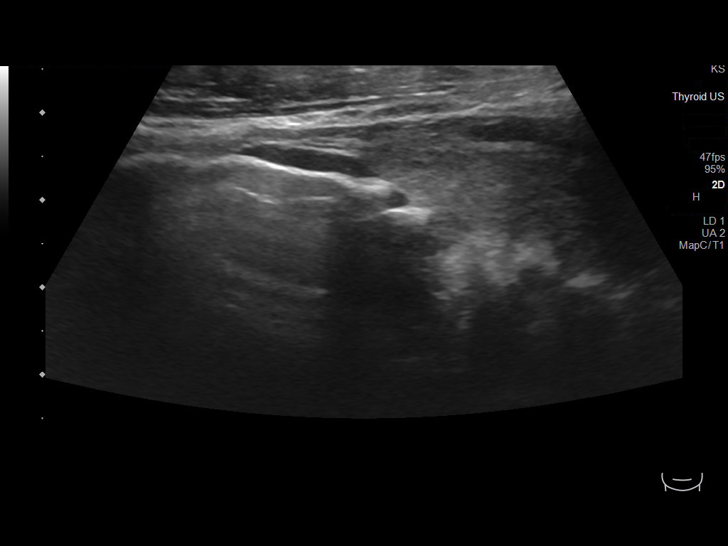
[im 29/29]
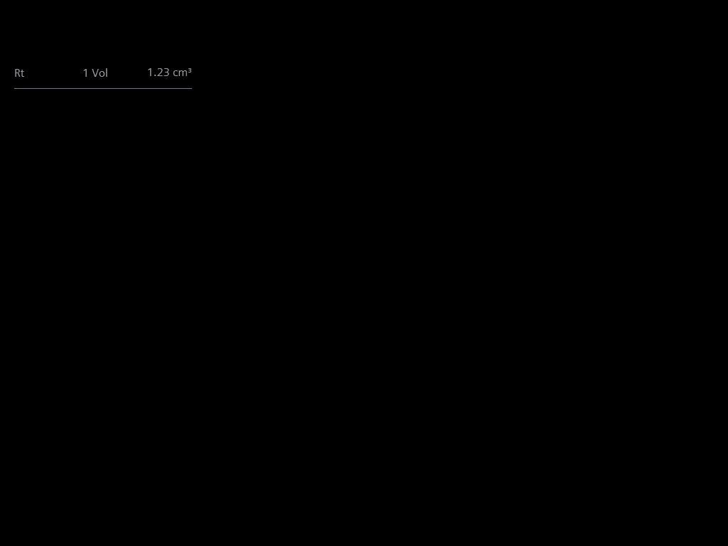

[14 of 25 positions shown; findings below may reference images not displayed]

FINDINGS: Parenchymal Echotexture: Normal

Isthmus: 0.4 cm

Right lobe: 4.3 x 1.9 x 1.8 cm

Left lobe: 4.5 x 1.7 x 1.4 cm

_________________________________________________________

Estimated total number of nodules >/= 1 cm: 0

Number of spongiform nodules >/=  2 cm not described below (TR1): 0

Number of mixed cystic and solid nodules >/= 1.5 cm not described
below (TR2): 0

_________________________________________________________

No discrete nodules are seen within the thyroid gland. Mildly
prominent right neck lymph nodes not enlarged by imaging criteria.
IMPRESSION: No significant sonographic abnormality of the thyroid.

The above is in keeping with the ACR TI-RADS recommendations - [HOSPITAL] 4505;[DATE].

## 2023-11-02 ENCOUNTER — Ambulatory Visit: Payer: Medicare Other | Admitting: Family Medicine

## 2023-11-08 ENCOUNTER — Encounter: Payer: Self-pay | Admitting: Nurse Practitioner

## 2023-11-08 ENCOUNTER — Other Ambulatory Visit: Payer: Self-pay | Admitting: Family Medicine

## 2023-11-08 DIAGNOSIS — E785 Hyperlipidemia, unspecified: Secondary | ICD-10-CM

## 2023-11-09 ENCOUNTER — Other Ambulatory Visit: Payer: Self-pay | Admitting: Nurse Practitioner

## 2023-11-09 DIAGNOSIS — E890 Postprocedural hypothyroidism: Secondary | ICD-10-CM

## 2023-11-14 ENCOUNTER — Ambulatory Visit (INDEPENDENT_AMBULATORY_CARE_PROVIDER_SITE_OTHER): Admitting: Internal Medicine

## 2023-11-14 ENCOUNTER — Encounter: Payer: Self-pay | Admitting: Internal Medicine

## 2023-11-14 VITALS — BP 110/80 | HR 60 | Temp 98.2°F | Ht 68.0 in | Wt 273.0 lb

## 2023-11-14 DIAGNOSIS — G4733 Obstructive sleep apnea (adult) (pediatric): Secondary | ICD-10-CM | POA: Diagnosis not present

## 2023-11-14 DIAGNOSIS — J449 Chronic obstructive pulmonary disease, unspecified: Secondary | ICD-10-CM | POA: Diagnosis not present

## 2023-11-14 MED ORDER — BREZTRI AEROSPHERE 160-9-4.8 MCG/ACT IN AERO
2.0000 | INHALATION_SPRAY | Freq: Two times a day (BID) | RESPIRATORY_TRACT | 5 refills | Status: AC
Start: 2023-11-14 — End: ?

## 2023-11-14 MED ORDER — BREZTRI AEROSPHERE 160-9-4.8 MCG/ACT IN AERO
2.0000 | INHALATION_SPRAY | Freq: Two times a day (BID) | RESPIRATORY_TRACT | Status: AC
Start: 2023-11-14 — End: ?

## 2023-11-14 NOTE — Progress Notes (Signed)
 Name: James Andersen MRN: [id number] DOB: 1961-07-16    CHIEF COMPLAINT:  EXCESSIVE DAYTIME SLEEPINESS ASSESSMENT OF COPD   HISTORY OF PRESENT ILLNESS: Patient is seen today for problems and issues with sleep related to excessive daytime sleepiness Patient  has been having sleep problems for many years Patient has been having excessive daytime sleepiness for a long time Patient has been having extreme fatigue and tiredness, lack of energy +  very Loud snoring every night + struggling breathe at night and gasps for air + morning headaches + Nonrefreshing sleep  Discussed sleep data and reviewed with patient.  Encouraged proper weight management.  Discussed driving precautions and its relationship with hypersomnolence.  Discussed operating dangerous equipment and its relationship with hypersomnolence.  Discussed sleep hygiene, and benefits of a fixed sleep waked time.  The importance of getting eight or more hours of sleep discussed with patient.  Discussed limiting the use of the computer and television before bedtime.  Decrease naps during the day, so night time sleep will become enhanced.  Limit caffeine, and sleep deprivation.  HTN, stroke, and heart failure are potential risk factors.    EPWORTH SLEEP SCORE 8  Assessment COPD Patient is a longtime smoker quit 40 years ago 1.5 pack a day for the last 40 years Patient with intermittent wheezing Progressive shortness of breath over the last several years Any Type of activity he gets short winded Patient uses Ventolin  inhaler however does help a little  With extensive smoking history patient is a candidate for lung cancer screening program Ambulatory pulse ox in the office did not reveal hypoxia    CAD/cardiac history 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 He denies chest pain, but has DOE and generalized fatigue. This may be multifactorial given thyroid  issues, general deconditioning, obesity (with reported  weight gain), HFpEF and possible lung component. Recent echo showed LVEF 55-60%, mild LVH, normal RV, mild MR, dilation of ascending aorta measuring 42mm. Heart monitor is pending. Patient is euvolemic on exam.Continue Aspirin  81mg  dialy, Lopressor  25mg BID, and Crestor  10mg  daily   Hyperthyroidism s/p ablation 08/2022  Post-ablative hypothyroidism Patient is currently on Synthroid  followed closely by Endocrinology.      PAST MEDICAL HISTORY :   has a past medical history of High cholesterol, Hypertension, Meniere's disease, and Thyroid  disease.  has a past surgical history that includes Vasectomy; Hernia repair; and LEFT HEART CATH AND CORONARY ANGIOGRAPHY (N/A, 01/10/2022). Prior to Admission medications   Medication Sig Start Date End Date Taking? Authorizing Provider  albuterol  (VENTOLIN  HFA) 108 (90 Base) MCG/ACT inhaler Inhale 1 puff into the lungs every 6 (six) hours as needed for wheezing or shortness of breath.    [provider]  amLODipine  (NORVASC ) 5 MG tablet Take 1 tablet (5 mg total) by mouth 2 (two) times daily. 06/26/23   Noreene Bearded, PA  aspirin  EC 81 MG tablet Take 81 mg by mouth daily. Swallow whole.    [provider]  guaiFENesin  (MUCINEX ) 600 MG 12 hr tablet Take 1 tablet (600 mg total) by mouth 2 (two) times daily. 06/29/23   Noreene Bearded, PA  hydrochlorothiazide  (HYDRODIURIL ) 25 MG tablet Take 1 tablet (25 mg total) by mouth daily. 06/15/23 09/13/23  Furth, Cadence H, PA-C  lisinopril  (ZESTRIL ) 40 MG tablet Take 1 tablet (40 mg total) by mouth daily. 06/26/23   Noreene Bearded, PA  metoprolol  tartrate (LOPRESSOR ) 50 MG tablet Take 0.5 tablets (25 mg total) by mouth 2 (two) times daily. 04/10/23  Maryclare Smoke A, PA  rosuvastatin  (CRESTOR ) 10 MG tablet TAKE ONE TABLET BY MOUTH ONE TIME DAILY 11/09/23   Laneta Pintos, MD  SYNTHROID  100 MCG tablet Take 1 tablet (100 mcg total) by mouth daily before breakfast. 07/06/23   Wendel Hals, NP   traZODone  (DESYREL ) 50 MG tablet Take 0.5 tablets (25 mg total) by mouth at bedtime as needed for sleep. 06/01/23   Noreene Bearded, PA   No Known Allergies  FAMILY HISTORY:  family history includes Cancer in his father and mother. SOCIAL HISTORY:  reports that he quit smoking about 6 years ago. His smoking use included cigarettes. He started smoking about 36 years ago. He has a 30 pack-year smoking history. He has never been exposed to tobacco smoke. He has never used smokeless tobacco. He reports that he does not currently use alcohol. He reports current drug use. Drug: Marijuana.   Review of Systems:  Gen:  Denies  fever, sweats, chills weight loss  HEENT: Denies blurred vision, double vision, ear pain, eye pain, hearing loss, nose bleeds, sore throat Cardiac:  No dizziness, chest pain or heaviness, chest tightness,edema, No JVD Resp:  + cough, -sputum production, +shortness of breath,+wheezing, -hemoptysis,  Gi: Denies swallowing difficulty, stomach pain, nausea or vomiting, diarrhea, constipation, bowel incontinence Gu:  Denies bladder incontinence, burning urine Ext:   Denies Joint pain, stiffness or swelling Skin: Denies  skin rash, easy bruising or bleeding or hives Endoc:  Denies polyuria, polydipsia , polyphagia or weight change Psych:   Denies depression, insomnia or hallucinations  Other:  All other systems negative   ALL OTHER ROS ARE NEGATIVE BP 110/80 (BP Location: Right Arm, Patient Position: Sitting, Cuff Size: Large)   Pulse 60   Temp 98.2 F (36.8 C) (Oral)   Ht 5\' 8"  (1.727 m)   Wt 273 lb (123.8 kg)   SpO2 98%   BMI 41.51 kg/m   Physical Examination:   General Appearance: No distress  EYES PERRLA, EOM intact.   NECK Supple, No JVD ORAL CAVITY MALLAMPATI 4 Pulmonary: normal breath sounds, No wheezing.  CardiovascularNormal S1,S2.  No m/r/g.   Abdomen: Benign, Soft, non-tender. Skin:   warm, no rashes, no ecchymosis  Extremities: normal, no cyanosis,  clubbing. Neuro:without focal findings,  speech normal  PSYCHIATRIC: Mood, affect within normal limits.   ALL OTHER ROS ARE NEGATIVE    ASSESSMENT AND PLAN SYNOPSIS  63 yo morbidly obese male Patient with signs and symptoms of excessive daytime sleepiness with probable underlying diagnosis of obstructive sleep apnea in the setting of obesity and deconditioned state, with signs and symptoms of underlying COPD with extensive smoking history, shortness of breath is multifactorial   Recommend Sleep Study for definitve diagnosis Home sleep study to assess for sleep apnea   Assessment of COPD I will obtain pulmonary function testing to assess lung function Recommend starting Breztri  inhaler 2 puffs twice a day Rinse mouth after use Use albuterol  as needed Avoid Allergens and Irritants Avoid secondhand smoke Avoid SICK contacts Recommend  Masking  when appropriate Recommend Keep up-to-date with vaccinations   Obesity -recommend significant weight loss -recommend changing diet  Deconditioned state -Recommend increased daily activity and exercise   Extensive smoking history Patient needs referral to lung cancer screening program   MEDICATION ADJUSTMENTS/LABS AND TESTS ORDERED: Assessment of shortness of breath Lets plan to obtain pulmonary function testing to assess lung function Lets start Breztri  inhaler 2 puffs in the morning and 2 puffs at night Please  rinse mouth after use To assess for sleep apnea we will order home sleep study Referral to the lung cancer screening program Highly recommend weight loss Avoid Allergens and Irritants Avoid secondhand smoke Avoid SICK contacts Recommend  Masking  when appropriate Recommend Keep up-to-date with vaccinations    CURRENT MEDICATIONS REVIEWED AT LENGTH WITH PATIENT TODAY   Patient  satisfied with Plan of action and management. All questions answered  Follow up 6 weeks  I spent a total of  65 minutes reviewing  chart data, face-to-face evaluation with the patient, counseling and coordination of care as detailed above.    Lady Pier, M.D.  [facility]  Medical Director [facility] Medical Director [facility] Cardio-Pulmonary Department

## 2023-11-14 NOTE — Patient Instructions (Signed)
 Assessment of shortness of breath Lets plan to obtain pulmonary function testing to assess lung function Lets start Breztri  inhaler 2 puffs in the morning and 2 puffs at night Please rinse mouth after use  To assess for sleep apnea we will order home sleep study  Referral to the lung cancer screening program  Highly recommend weight loss  Avoid Allergens and Irritants Avoid secondhand smoke Avoid SICK contacts Recommend  Masking  when appropriate Recommend Keep up-to-date with vaccinations

## 2023-11-15 IMAGING — CT CT CHEST W/O CM
1 series · 15 of 34 positions shown, 19 images · non-contrast
Comparison: Chest x-ray 10/15/2021

CLINICAL DATA: Shortness of breath, chronic



[Series 2: chest w/o 2mm st · axial · non-contrast · 0.88mm/px · z∈[-344,-68]mm · 15 of 163 slices shown, 19 images]
[im 13/163  mediastinal]
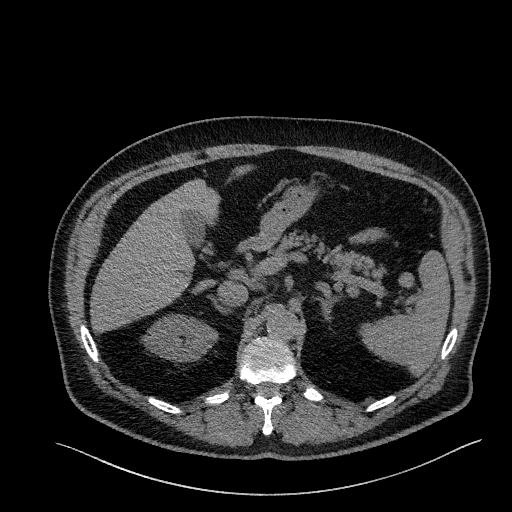
[im 13/163  lung]
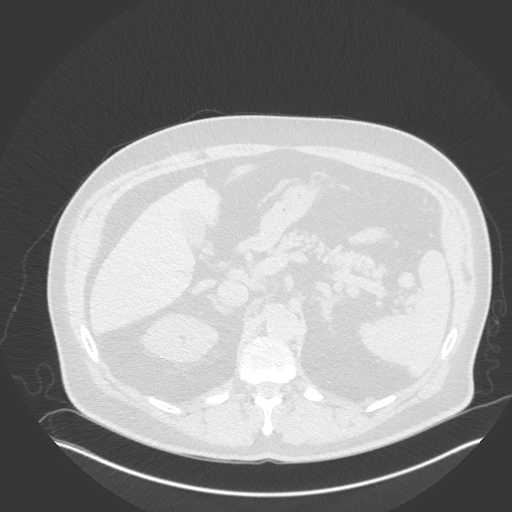
[im 25/163  lung]
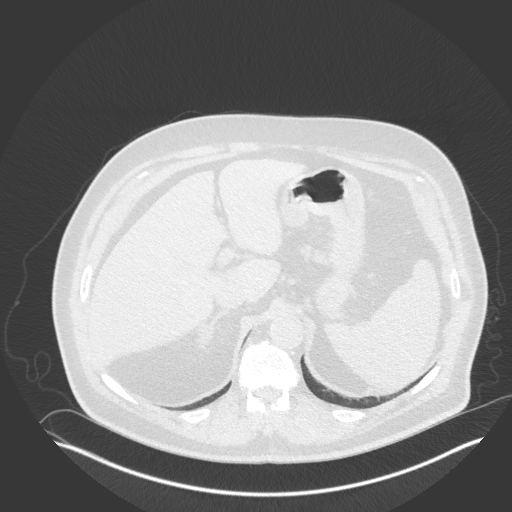
[im 33/163  lung]
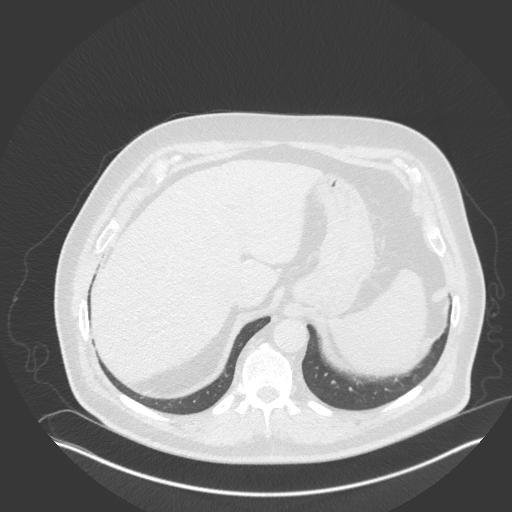
[im 43/163  lung]
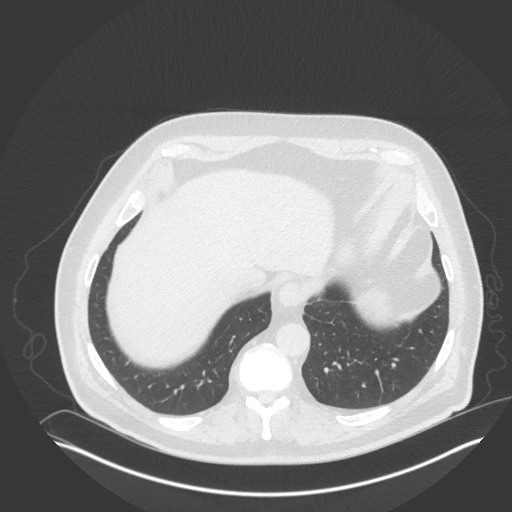
[im 55/163  mediastinal]
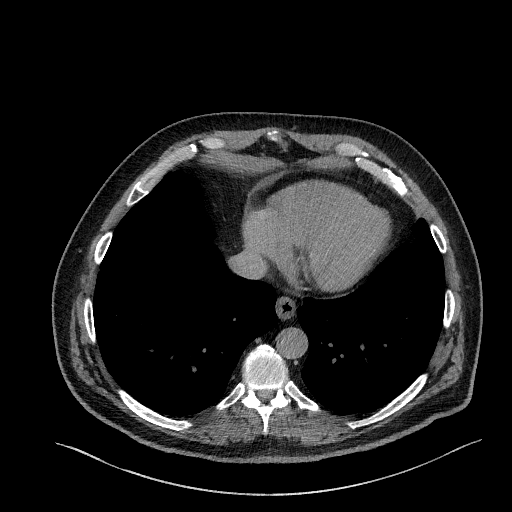
[im 55/163  lung]
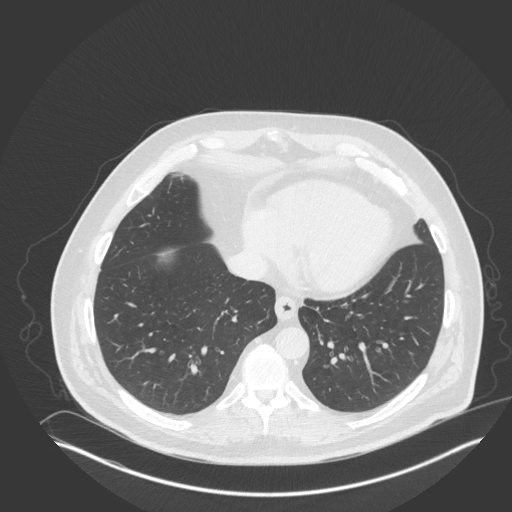
[im 65/163  lung]
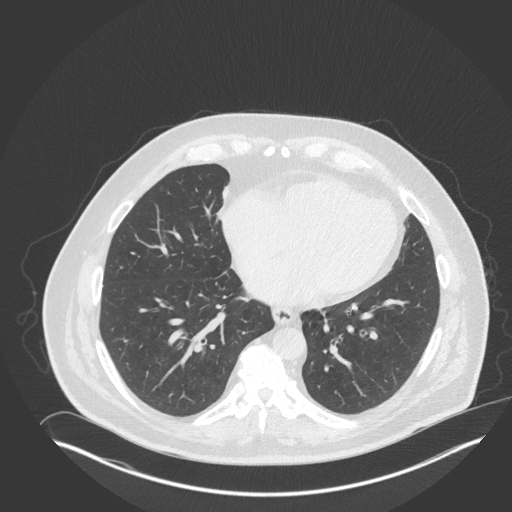
[im 73/163  lung]
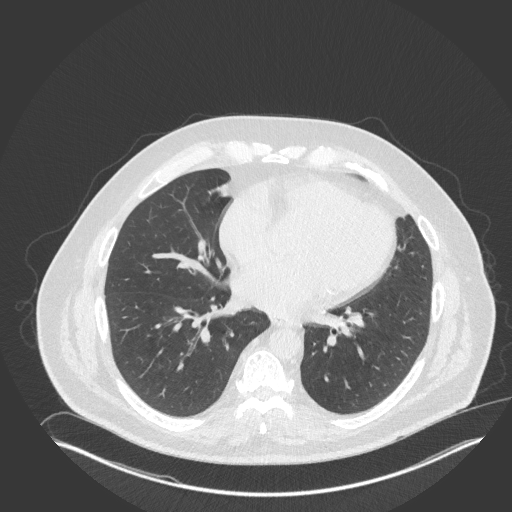
[im 85/163  lung]
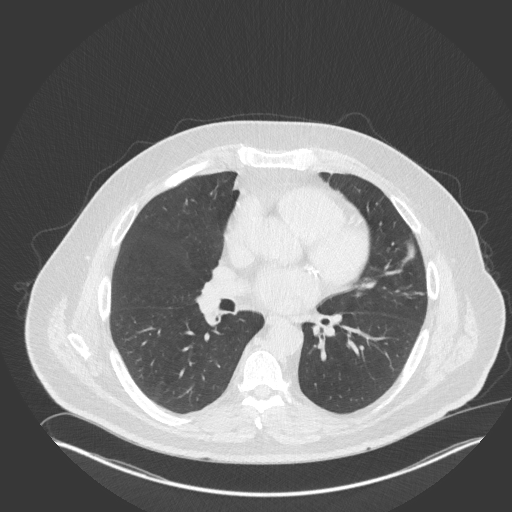
[im 91/163  mediastinal]
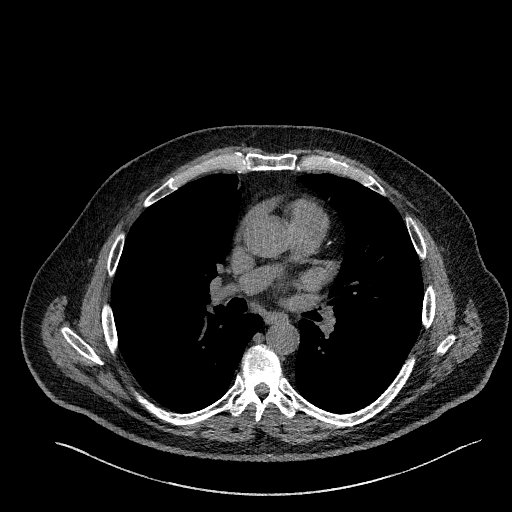
[im 91/163  lung]
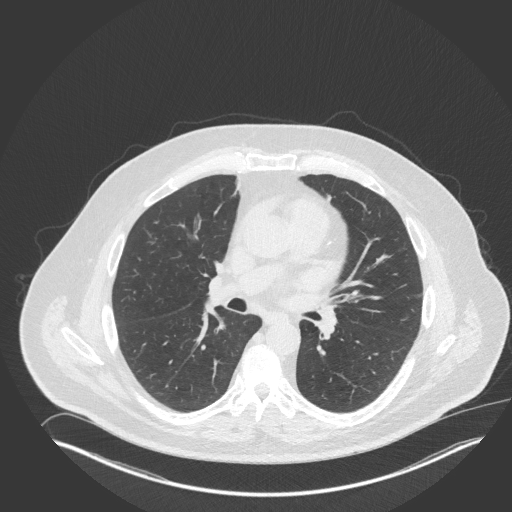
[im 98/163  lung]
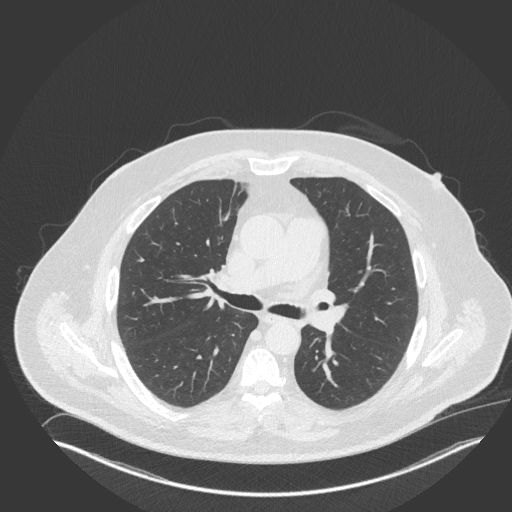
[im 109/163  lung]
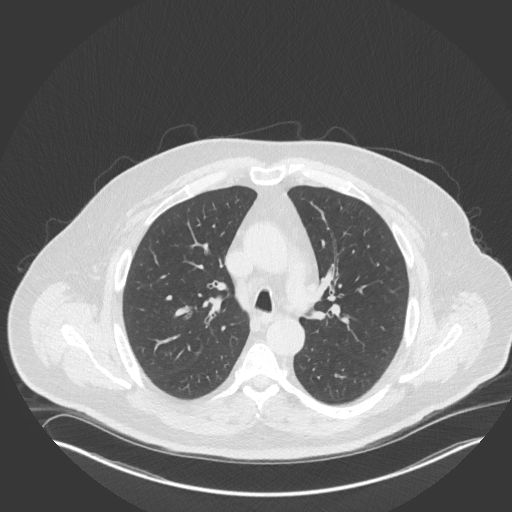
[im 121/163  lung]
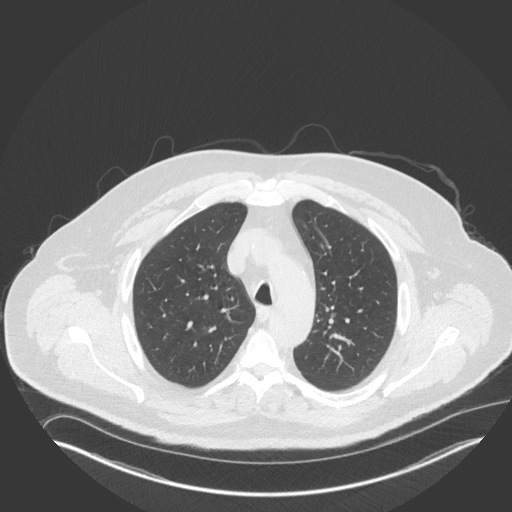
[im 130/163  mediastinal]
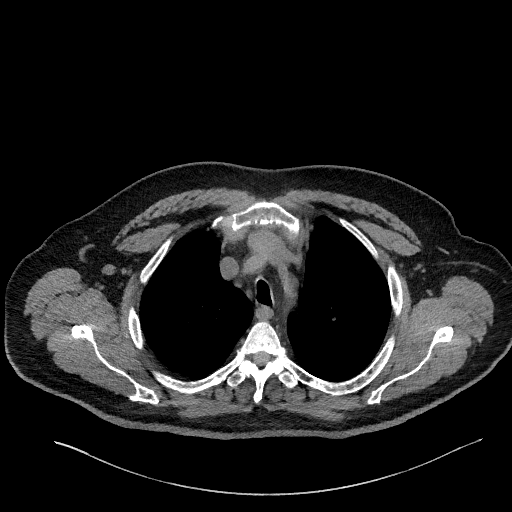
[im 130/163  lung]
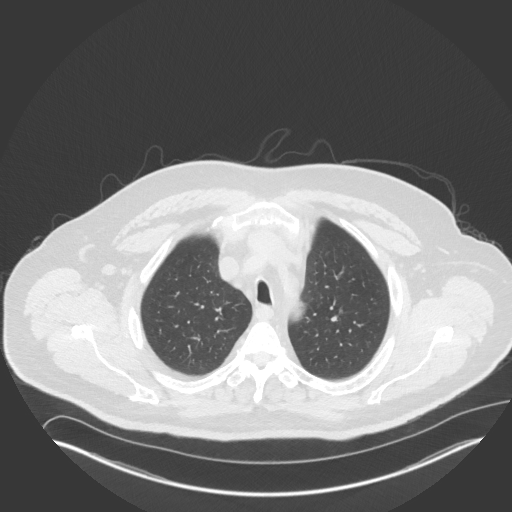
[im 139/163  lung]
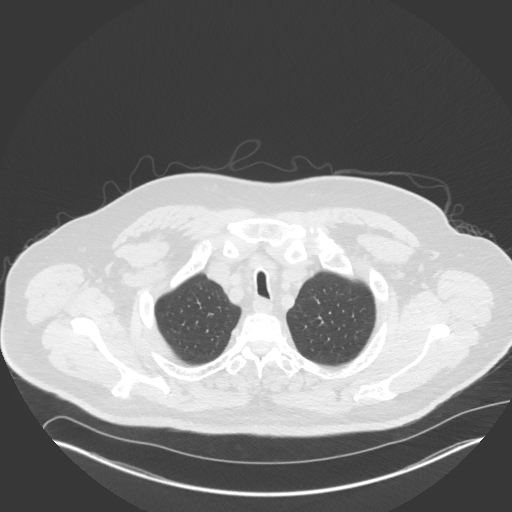
[im 151/163  lung]
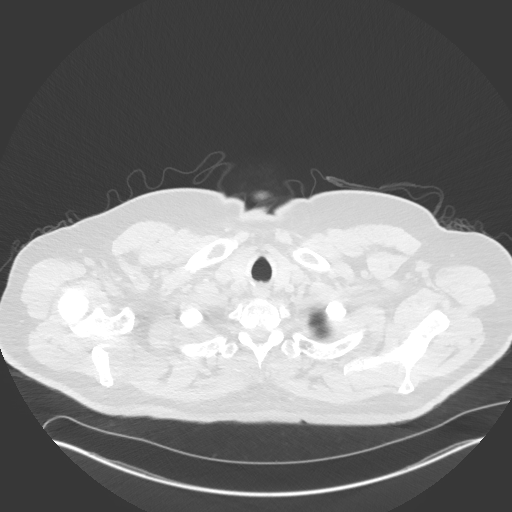

[15 of 34 positions shown; findings below may reference images not displayed]

FINDINGS: Cardiovascular: Heart is normal size. Extensive coronary artery
calcifications throughout the left anterior and left circumflex
coronary arteries. Scattered aortic calcifications. No aneurysm.

Mediastinum/Nodes: No mediastinal, hilar, or axillary adenopathy.
Trachea and esophagus are unremarkable. Thyroid unremarkable.

Lungs/Pleura: Linear scarring in the right middle lobe and lingula.
No confluent opacities, effusions or suspicious nodules.

Upper Abdomen: No acute findings

Musculoskeletal: Chest wall soft tissues are unremarkable. No acute
bony abnormality.
IMPRESSION: No acute cardiopulmonary disease.

Extensive coronary artery calcifications.

Aortic Atherosclerosis ([id number]).

## 2023-11-16 ENCOUNTER — Encounter: Payer: Self-pay | Admitting: Nurse Practitioner

## 2023-11-16 LAB — TSH: TSH: 5.29 u[IU]/mL — ABNORMAL HIGH (ref 0.450–4.500)

## 2023-11-16 LAB — T4, FREE: Free T4: 1.39 ng/dL (ref 0.82–1.77)

## 2023-11-16 LAB — T3, FREE: T3, Free: 2.6 pg/mL (ref 2.0–4.4)

## 2023-11-16 NOTE — Telephone Encounter (Signed)
 Does pt need f/u?

## 2023-11-27 ENCOUNTER — Encounter: Payer: Self-pay | Admitting: Family Medicine

## 2023-11-27 ENCOUNTER — Ambulatory Visit (INDEPENDENT_AMBULATORY_CARE_PROVIDER_SITE_OTHER): Admitting: Family Medicine

## 2023-11-27 VITALS — BP 137/90 | HR 56 | Ht 68.0 in | Wt 275.0 lb

## 2023-11-27 DIAGNOSIS — F17211 Nicotine dependence, cigarettes, in remission: Secondary | ICD-10-CM | POA: Diagnosis not present

## 2023-11-27 DIAGNOSIS — R0602 Shortness of breath: Secondary | ICD-10-CM

## 2023-11-27 DIAGNOSIS — Z122 Encounter for screening for malignant neoplasm of respiratory organs: Secondary | ICD-10-CM | POA: Diagnosis not present

## 2023-11-27 DIAGNOSIS — E890 Postprocedural hypothyroidism: Secondary | ICD-10-CM

## 2023-11-27 MED ORDER — MIRTAZAPINE 15 MG PO TBDP: 15.0000 mg | ORAL_TABLET | Freq: Every day | ORAL | 2 refills | Status: DC

## 2023-11-27 NOTE — Progress Notes (Unsigned)
   Established Patient Office Visit  Subjective   Patient ID: James Andersen, male    DOB: 1960-12-06  Age: 63 y.o. MRN: [id number]  No chief complaint on file.   HPI  Subjective - Shortness of breath with exertion, unable to walk to kitchen without dyspnea - Difficulty breathing at night, must sit up to catch breath - Wakes after 1-2 hours sleep, difficulty staying asleep - Reports anxiety about breathing issues, especially at night - Symptoms worse when lying flat - Albuterol  helps somewhat with nocturnal dyspnea - Reports improvement in breathing when previously on methimazole , worsening when switched to levothyroxine  - Recent loss of brother from heart attack increasing anxiety - No chest pain - Symptoms improve quickly when stops exertion - Difficulty with deep breathing, describes trying to "get over the hump" with exhaling - Reports constant tinnitus in deaf ear (Meniere's disease) - Reports nausea from Meniere's disease - Reports hernia from vomiting related to Meniere's  Medications: Brezzy (reports minimal benefit), albuterol  rescue inhaler (helps somewhat), trazodone  (ineffective for sleep, causes daytime drowsiness), Crestor  (previously higher dose caused muscle pain, now taking daily), levothyroxine , baby aspirin , Zyrtec, Flonase .  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  PSH: Cardiac catheterization (no stents placed).  FH: Brother recently deceased from massive heart attack.  Social Hx: Former smoker, quit 5-6 years ago.  ROS: Positive for dyspnea on exertion, orthopnea, paroxysmal nocturnal dyspnea, anxiety, insomnia, tinnitus, nausea. Negative for chest pain, leg swelling.   The 10-year ASCVD risk score (Arnett DK, et al., 2019) is: 8.6%  Health Maintenance Due  Topic Date Due    COVID-19 Vaccine (1) Never done   Hepatitis C Screening  Never done   DTaP/Tdap/Td (1 - Tdap) Never done   Pneumococcal Vaccine 81-17 Years old (1 of 2 - PCV) Never done   Zoster Vaccines- Shingrix (1 of 2) Never done   Colonoscopy  Never done   Lung Cancer Screening  11/24/2022   Medicare Annual Wellness (AWV)  02/03/2023      Objective:     There were no vitals taken for this visit. {Vitals History (Optional):23777}  Physical Exam   No results found for any visits on 11/27/23.      Assessment & Plan:   There are no diagnoses linked to this encounter.   No follow-ups on file.    Laneta Pintos, MD

## 2023-11-27 NOTE — Patient Instructions (Addendum)
 It was nice to see you today,  We addressed the following topics today: --I am sending in an order for a lung cancer screening CT scan.  Someone call you to schedule this - Go ahead and schedule your sleep apnea test as well - I would also recommend scheduling a follow-up with your cardiologist in the next few months. - I am starting Remeron.  Take this at night for sleep.  Will follow-up in 1 month to discuss if it needs to be adjusted at all  Have a great day,  Etha Henle, MD

## 2023-11-30 ENCOUNTER — Encounter: Payer: Self-pay | Admitting: Family Medicine

## 2023-11-30 ENCOUNTER — Telehealth: Payer: Self-pay | Admitting: Acute Care

## 2023-11-30 ENCOUNTER — Other Ambulatory Visit: Payer: Self-pay

## 2023-11-30 DIAGNOSIS — Z87891 Personal history of nicotine dependence: Secondary | ICD-10-CM

## 2023-11-30 DIAGNOSIS — E039 Hypothyroidism, unspecified: Secondary | ICD-10-CM | POA: Insufficient documentation

## 2023-11-30 DIAGNOSIS — Z122 Encounter for screening for malignant neoplasm of respiratory organs: Secondary | ICD-10-CM

## 2023-11-30 NOTE — Assessment & Plan Note (Signed)
 Pt has had extensive workup in the past including a cardiac cath that showed 30% occlusion and no stents placed.  Gets minimal improvement with albuterol .  Symptoms he describes seem most consistent with a cardiac cause (pnd, orthopnea, doe) but last years echo showed normal ef/LV funciton with no aortic stenosis, no evidence of PulmHTN.  He has had a CT scan in 2023 that did not show any significant pulm abnormalities.  Will get low dose CT for cancer screening.  Will have pt schedule sleep study for OSA.  Advised him to reach out to pulm regarding the PFTs they wanted.   Even though his cath did not show any significant occlusions, he did have the positive stress test, CT scan showing significant coronary artery calcifications, has a family hx of cardiac disease, risk factors including smoking, and dypsnea on exertion that improves with rest.  Recommended he follow up with his cardiologist again in the near future.  Continue maximizing medical therapy for now.  Consider discussing Imdur at next visit with pt for BP and exertional dyspnea that may be related to angina.

## 2023-11-30 NOTE — Telephone Encounter (Signed)
 Lung Cancer Screening Narrative/Criteria Questionnaire (Cigarette Smokers Only- No Cigars/Pipes/vapes)   James Andersen   SDMV:12/19/23 at 10am / [place]                                           10/29/1960              LDCT: 12/21/23 at 10am/ [facility] [place]    63 y.o.   Phone: [phone]  Lung Screening Narrative (confirm age 64-77 yrs Medicare / 50-80 yrs Private pay insurance)   Insurance information:Medicare A/B   Referring Provider:Kasa   This screening involves an initial phone call with a team member from our program. It is called a shared decision making visit. The initial meeting is required by insurance and Medicare to make sure you understand the program. This appointment takes about 15-20 minutes to complete. The CT scan will completed at a separate date/time. This scan takes about 5-10 minutes to complete and you may eat and drink before and after the scan.  Criteria questions for Lung Cancer Screening:   Are you a current or former smoker? Former Age began smoking: 63 yo   If you are a former smoker, what year did you quit smoking? 2017   To calculate your smoking history, I need an accurate estimate of how many packs of cigarettes you smoked per day and for how many years. (Not just the number of PPD you are now smoking)   Years smoking 31 x Packs per day 1 = Pack years 31   (at least 20 pack yrs)   (Make sure they understand that we need to know how much they have smoked in the past, not just the number of PPD they are smoking now)  Do you have a personal history of cancer?  No    Do you have a family history of cancer? Yes  (cancer type and and relative) father /lung and mother / lymphoma  Are you coughing up blood?  No  Have you had unexplained weight loss of 15 lbs or more in the last 6 months? No  It looks like you meet all criteria.     Additional information: N/A

## 2023-11-30 NOTE — Assessment & Plan Note (Signed)
 Now on synthroid .  Most recent tsh is ~5. Continue mgmt per endocrinology

## 2023-12-03 ENCOUNTER — Other Ambulatory Visit: Payer: Self-pay | Admitting: Family Medicine

## 2023-12-03 MED ORDER — SERTRALINE HCL 50 MG PO TABS: 50.0000 mg | ORAL_TABLET | Freq: Every day | ORAL | 3 refills | Status: DC

## 2023-12-04 ENCOUNTER — Other Ambulatory Visit: Payer: Self-pay | Admitting: Nurse Practitioner

## 2023-12-19 ENCOUNTER — Encounter

## 2023-12-21 ENCOUNTER — Inpatient Hospital Stay: Admission: RE | Admit: 2023-12-21 | Source: Ambulatory Visit

## 2023-12-27 ENCOUNTER — Other Ambulatory Visit: Payer: Self-pay | Admitting: Family Medicine

## 2023-12-27 DIAGNOSIS — I10 Essential (primary) hypertension: Secondary | ICD-10-CM

## 2024-01-01 ENCOUNTER — Telehealth: Payer: Self-pay | Admitting: Internal Medicine

## 2024-01-01 NOTE — Telephone Encounter (Signed)
 Patient needs PFT prior to 01/10/2024.

## 2024-01-04 ENCOUNTER — Other Ambulatory Visit: Payer: Self-pay

## 2024-01-04 ENCOUNTER — Encounter: Payer: Self-pay | Admitting: Nurse Practitioner

## 2024-01-04 ENCOUNTER — Ambulatory Visit: Admitting: *Deleted

## 2024-01-04 DIAGNOSIS — E890 Postprocedural hypothyroidism: Secondary | ICD-10-CM

## 2024-01-04 DIAGNOSIS — Z87891 Personal history of nicotine dependence: Secondary | ICD-10-CM

## 2024-01-04 MED ORDER — LEVOTHYROXINE SODIUM 100 MCG PO TABS: 100.0000 ug | ORAL_TABLET | Freq: Every day | ORAL | 0 refills | Status: DC

## 2024-01-04 NOTE — Telephone Encounter (Signed)
 I have made pt appt, can you put in orders for his labs before that appt?

## 2024-01-04 NOTE — Patient Instructions (Signed)

## 2024-01-04 NOTE — Progress Notes (Signed)
 Virtual Visit via Telephone Note  I connected with James Andersen on 01/04/24 at 11:30 AM EDT by telephone and verified that I am speaking with the correct person using two identifiers.  Location: Patient: James Andersen Provider: Alyse Bach, RN   I discussed the limitations, risks, security and privacy concerns of performing an evaluation and management service by telephone and the availability of in person appointments. I also discussed with the patient that there may be a patient responsible charge related to this service. The patient expressed understanding and agreed to proceed.   Shared Decision Making Visit Lung Cancer Screening Program [phone])   Eligibility: Age 63 y.o. Pack Years Smoking History Calculation 33 (# packs/per year x # years smoked) Recent History of coughing up blood  no Unexplained weight loss? no ( >Than 15 pounds within the last 6 months ) Prior History Lung / other cancer no (Diagnosis within the last 5 years already requiring surveillance chest CT Scans). Smoking Status Former Smoker Former Smokers: Years since quit: 7 years  Quit Date: 2018  Visit Components: Discussion included one or more decision making aids. yes Discussion included risk/benefits of screening. yes Discussion included potential follow up diagnostic testing for abnormal scans. yes Discussion included meaning and risk of over diagnosis. yes Discussion included meaning and risk of False Positives. yes Discussion included meaning of total radiation exposure. yes  Counseling Included: Importance of adherence to annual lung cancer LDCT screening. yes Impact of comorbidities on ability to participate in the program. yes Ability and willingness to under diagnostic treatment. yes  Smoking Cessation Counseling: Current Smokers:  Discussed importance of smoking cessation. yes Information about tobacco cessation classes and interventions provided to patient. yes Patient provided  with ticket for LDCT Scan. no Symptomatic Patient. no  Counseling(Intermediate counseling: > three minutes) 99406 Diagnosis Code: Tobacco Use Z72.0 Asymptomatic Patient no  Counseling (Intermediate counseling: > three minutes counseling) U0454 Former Smokers:  Discussed the importance of maintaining cigarette abstinence. yes Diagnosis Code: Personal History of Nicotine Dependence. U98.119 Information about tobacco cessation classes and interventions provided to patient. Yes Patient provided with ticket for LDCT Scan. no Written Order for Lung Cancer Screening with LDCT placed in Epic. Yes (CT Chest Lung Cancer Screening Low Dose W/O CM) [id number] Z12.2-Screening of respiratory organs Z87.891-Personal history of nicotine dependence   Alyse Bach, RN

## 2024-01-05 ENCOUNTER — Encounter

## 2024-01-05 DIAGNOSIS — G4733 Obstructive sleep apnea (adult) (pediatric): Secondary | ICD-10-CM

## 2024-01-08 ENCOUNTER — Ambulatory Visit
Admission: RE | Admit: 2024-01-08 | Discharge: 2024-01-08 | Disposition: A | Discharge status: Home / Self Care | Source: Ambulatory Visit | Attending: Acute Care | Admitting: Acute Care

## 2024-01-08 ENCOUNTER — Encounter

## 2024-01-08 DIAGNOSIS — Z87891 Personal history of nicotine dependence: Secondary | ICD-10-CM

## 2024-01-08 DIAGNOSIS — Z122 Encounter for screening for malignant neoplasm of respiratory organs: Secondary | ICD-10-CM

## 2024-01-09 ENCOUNTER — Ambulatory Visit: Admitting: Family Medicine

## 2024-01-10 ENCOUNTER — Ambulatory Visit: Admitting: Internal Medicine

## 2024-01-22 ENCOUNTER — Ambulatory Visit: Payer: Self-pay | Admitting: Family Medicine

## 2024-01-22 ENCOUNTER — Other Ambulatory Visit: Payer: Self-pay | Admitting: Acute Care

## 2024-01-22 DIAGNOSIS — Z87891 Personal history of nicotine dependence: Secondary | ICD-10-CM

## 2024-01-22 DIAGNOSIS — Z122 Encounter for screening for malignant neoplasm of respiratory organs: Secondary | ICD-10-CM

## 2024-01-23 DIAGNOSIS — G4733 Obstructive sleep apnea (adult) (pediatric): Secondary | ICD-10-CM | POA: Diagnosis not present

## 2024-02-08 ENCOUNTER — Other Ambulatory Visit: Payer: Self-pay | Admitting: Family Medicine

## 2024-02-08 DIAGNOSIS — E785 Hyperlipidemia, unspecified: Secondary | ICD-10-CM

## 2024-02-20 ENCOUNTER — Other Ambulatory Visit: Payer: Self-pay | Admitting: Family Medicine

## 2024-02-20 DIAGNOSIS — E785 Hyperlipidemia, unspecified: Secondary | ICD-10-CM

## 2024-02-28 ENCOUNTER — Ambulatory Visit (INDEPENDENT_AMBULATORY_CARE_PROVIDER_SITE_OTHER): Admitting: Family Medicine

## 2024-02-28 ENCOUNTER — Encounter: Payer: Self-pay | Admitting: Family Medicine

## 2024-02-28 VITALS — BP 123/67 | HR 60 | Ht 68.0 in

## 2024-02-28 DIAGNOSIS — R0981 Nasal congestion: Secondary | ICD-10-CM | POA: Diagnosis not present

## 2024-02-28 DIAGNOSIS — E7800 Pure hypercholesterolemia, unspecified: Secondary | ICD-10-CM

## 2024-02-28 DIAGNOSIS — G4733 Obstructive sleep apnea (adult) (pediatric): Secondary | ICD-10-CM

## 2024-02-28 DIAGNOSIS — R0602 Shortness of breath: Secondary | ICD-10-CM

## 2024-02-28 DIAGNOSIS — F321 Major depressive disorder, single episode, moderate: Secondary | ICD-10-CM | POA: Diagnosis not present

## 2024-02-28 DIAGNOSIS — I10 Essential (primary) hypertension: Secondary | ICD-10-CM

## 2024-02-28 DIAGNOSIS — E890 Postprocedural hypothyroidism: Secondary | ICD-10-CM

## 2024-02-28 NOTE — Progress Notes (Unsigned)
   Established Patient Office Visit  Subjective   Patient ID: James Andersen, male    DOB: 12-03-1960  Age: 63 y.o. MRN: [id number]  Chief Complaint  Patient presents with  . Medical Management of Chronic Issues    HPI  Lung rads 2   PAH  Statin     The 10-year ASCVD risk score (Arnett DK, et al., 2019) is: 13.6%  Health Maintenance Due  Topic Date Due  . COVID-19 Vaccine (1) Never done  . Hepatitis C Screening  Never done  . DTaP/Tdap/Td (1 - Tdap) Never done  . Pneumococcal Vaccine: 50+ Years (1 of 2 - PCV) Never done  . Zoster Vaccines- Shingrix (1 of 2) Never done  . Colonoscopy  Never done  . Medicare Annual Wellness (AWV)  02/03/2023  . INFLUENZA VACCINE  02/23/2024      Objective:     There were no vitals taken for this visit. {Vitals History (Optional):23777}  Physical Exam   No results found for any visits on 02/28/24.      Assessment & Plan:   There are no diagnoses linked to this encounter.   No follow-ups on file.    Toribio MARLA Slain, MD

## 2024-02-28 NOTE — Patient Instructions (Addendum)
 It was nice to see you today,  We addressed the following topics today: -I am ordering some blood test to recheck your cholesterol and thyroid . - Your sleep study showed you have obstructive sleep apnea. - Talk to your pulmonologist and your cardiologist when you see them at your upcoming visits to discuss the findings on your CT suggestive of pulmonary hypertension or if it needs further workup.  This could be what is causing your shortness of breath. - For postnasal drip the treatment is over-the-counter antihistamines like Zyrtec or Claritin and nasal corticosteroids like Flonase  daily.  Use the nasal corticosteroid as follows: 2 sprays each nostril every day.  You can also use nasal saline sprays at night in addition to the Flonase .  Have a great day,  Rolan Slain, MD

## 2024-03-01 DIAGNOSIS — R0981 Nasal congestion: Secondary | ICD-10-CM | POA: Insufficient documentation

## 2024-03-01 DIAGNOSIS — F321 Major depressive disorder, single episode, moderate: Secondary | ICD-10-CM | POA: Insufficient documentation

## 2024-03-01 DIAGNOSIS — G4733 Obstructive sleep apnea (adult) (pediatric): Secondary | ICD-10-CM | POA: Insufficient documentation

## 2024-03-01 NOTE — Assessment & Plan Note (Signed)
 Reports ongoing dyspnea on exertion. CT scan from 12/2023 showed pulmonary artery enlargement suggestive of pulmonary hypertension. This is a new finding not seen on multiple prior echocardiograms, with the most recent being less than a year ago. Discussed how OSA is a cause of pulmonary hypertension.     - Advised to discuss the CT finding and need for further workup for pulmonary hypertension with his pulmonologist (appointment in 2 weeks) and cardiologist. May need right heart catheterization for definitive diagnostic test.     - Explained that treating the underlying cause, likely OSA, is the primary management.     - Patient has a yearly low-dose CT scan for lung cancer screening due to smoking history (quit 2017). The recent scan showed benign-appearing nodules, recommend 39-month follow-up per screening protocol.

## 2024-03-01 NOTE — Assessment & Plan Note (Signed)
 Recent home sleep study results noted as severe abnormal breathing events, with 160 apneic events recorded. This is a significant contributor to fatigue, shortness of breath, and potentially pulmonary hypertension. Patient is not interested in CPAP therapy.     - Discussed treatment options including Inspire implant, oral appliances (less effective for severe OSA), and weight loss.     - Discussed that Medicare Part D may cover weight loss medications (e.g., Zepbound, Wegovy) for OSA, but patient does not have Part D and pays cash for prescriptions. Patient reports recent 8-pound weight loss from dietary changes.

## 2024-03-01 NOTE — Assessment & Plan Note (Signed)
 Reports significant mucus and post-nasal drip contributing to breathing difficulty, especially at night. Has used Flonase  PRN with some benefit. Zyrtec and Mucinex  were ineffective.     - Counseled on using Flonase  daily (2 sprays each nostril daily) for maximal effectiveness, pointing the spray towards the ipsilateral eye.     - Recommended considering nasal saline irrigation at night.     - Discussed switching to another nasal steroid like Nasonex or Nasacort if Flonase  is not effective with daily use.

## 2024-03-01 NOTE — Assessment & Plan Note (Signed)
 Reports depressed mood and anxiety, worsened since his brother's death in 2023/09/02. This is also contributing to his sleep disturbance (fear of dying in sleep).     - Continue Zoloft .

## 2024-03-01 NOTE — Assessment & Plan Note (Signed)
 Body mass index is 41.81 kg/m. W/ OSA, HTN, HLD.  Would benefit from glp1, but Does not have insurance for prescriptions.  Continue to encourage weight loss and healthy dieting.

## 2024-03-12 ENCOUNTER — Ambulatory Visit: Payer: Self-pay | Admitting: Family Medicine

## 2024-03-13 ENCOUNTER — Encounter: Payer: Self-pay | Admitting: Nurse Practitioner

## 2024-03-14 LAB — BRAIN NATRIURETIC PEPTIDE: BNP: 60.7 pg/mL (ref 0.0–100.0)

## 2024-03-14 LAB — COMPREHENSIVE METABOLIC PANEL WITH GFR
ALT: 13 IU/L (ref 0–44)
AST: 16 IU/L (ref 0–40)
Albumin: 4.6 g/dL (ref 3.9–4.9)
Alkaline Phosphatase: 60 IU/L (ref 44–121)
BUN/Creatinine Ratio: 14 (ref 10–24)
BUN: 16 mg/dL (ref 8–27)
Bilirubin Total: 0.7 mg/dL (ref 0.0–1.2)
CO2: 23 mmol/L (ref 20–29)
Calcium: 9.9 mg/dL (ref 8.6–10.2)
Chloride: 100 mmol/L (ref 96–106)
Creatinine, Ser: 1.15 mg/dL (ref 0.76–1.27)
Globulin, Total: 2.4 g/dL (ref 1.5–4.5)
Glucose: 104 mg/dL — ABNORMAL HIGH (ref 70–99)
Potassium: 3.8 mmol/L (ref 3.5–5.2)
Sodium: 141 mmol/L (ref 134–144)
Total Protein: 7 g/dL (ref 6.0–8.5)
eGFR: 71 mL/min/1.73 (ref >59)

## 2024-03-14 LAB — LIPID PANEL
Chol/HDL Ratio: 3.3 ratio (ref 0.0–5.0)
Cholesterol, Total: 147 mg/dL (ref 100–199)
HDL: 44 mg/dL (ref >39)
LDL Chol Calc (NIH): 78 mg/dL (ref 0–99)
Triglycerides: 143 mg/dL (ref 0–149)
VLDL Cholesterol Cal: 25 mg/dL (ref 5–40)

## 2024-03-14 LAB — TSH: TSH: 7.4 u[IU]/mL — ABNORMAL HIGH (ref 0.450–4.500)

## 2024-03-20 ENCOUNTER — Ambulatory Visit: Admitting: Internal Medicine

## 2024-03-20 ENCOUNTER — Encounter: Payer: Self-pay | Admitting: Internal Medicine

## 2024-03-20 VITALS — BP 106/70 | HR 54 | Temp 98.7°F | Ht 68.0 in | Wt 265.2 lb

## 2024-03-20 DIAGNOSIS — G4733 Obstructive sleep apnea (adult) (pediatric): Secondary | ICD-10-CM

## 2024-03-20 DIAGNOSIS — Z122 Encounter for screening for malignant neoplasm of respiratory organs: Secondary | ICD-10-CM

## 2024-03-20 DIAGNOSIS — J449 Chronic obstructive pulmonary disease, unspecified: Secondary | ICD-10-CM | POA: Diagnosis not present

## 2024-03-20 LAB — PULMONARY FUNCTION TEST
DL/VA % pred: 74 %
DL/VA: 3.08 ml/min/mmHg/L
DLCO unc % pred: 80 %
DLCO unc: 20.16 ml/min/mmHg
FEF 25-75 Post: 1.95 L/s
FEF 25-75 Pre: 0.88 L/s
FEF2575-%Change-Post: 120 %
FEF2575-%Pred-Post: 76 %
FEF2575-%Pred-Pre: 34 %
FEV1-%Change-Post: 32 %
FEV1-%Pred-Post: 72 %
FEV1-%Pred-Pre: 55 %
FEV1-Post: 2.32 L
FEV1-Pre: 1.75 L
FEV1FVC-%Change-Post: 7 %
FEV1FVC-%Pred-Pre: 77 %
FEV6-%Change-Post: 22 %
FEV6-%Pred-Post: 89 %
FEV6-%Pred-Pre: 73 %
FEV6-Post: 3.63 L
FEV6-Pre: 2.97 L
FEV6FVC-%Change-Post: 0 %
FEV6FVC-%Pred-Post: 103 %
FEV6FVC-%Pred-Pre: 104 %
FVC-%Change-Post: 22 %
FVC-%Pred-Post: 86 %
FVC-%Pred-Pre: 70 %
FVC-Post: 3.71 L
FVC-Pre: 3.02 L
Post FEV1/FVC ratio: 62 %
Post FEV6/FVC ratio: 98 %
Pre FEV1/FVC ratio: 58 %
Pre FEV6/FVC Ratio: 99 %
RV % pred: 189 %
RV: 4.25 L
TLC % pred: 122 %
TLC: 8.09 L

## 2024-03-20 MED ORDER — FLUTICASONE-SALMETEROL 230-21 MCG/ACT IN AERO: 2.0000 | INHALATION_SPRAY | Freq: Two times a day (BID) | RESPIRATORY_TRACT | 12 refills | Status: AC

## 2024-03-20 NOTE — Progress Notes (Unsigned)
 Name: James Andersen MRN: [id number] DOB: 09/22/1960     HST 12/2023  Severe OSA AHI 33   PFT 02/2024 Reviewed with patient in detail today postbronchodilator FEV1 FVC ratio 62% predicted FEV1 is 72% predicted FVC is 86% predicted There is a significant bronchodilator response with greater than 30% change Total lung capacity is 122% predicted RV is 189% predicted RV/TLC ratio is 153% predicted DLCO is 80% predicted Flow volume loops expiratory limb shows significant scooping consistent with obstructive pattern Final interpretation is moderate obstructive lung disease consistent with COPD  LDCT 12/2023  No significant findings for lung cancer    CHIEF COMPLAINT:  Assessment of OSA ASSESSMENT OF COPD   HISTORY OF PRESENT ILLNESS:  Assessment COPD Patient is a longtime smoker quit 40 years ago 1.5 pack a day for the last 40 years Patient with intermittent wheezing Progressive shortness of breath over the last several years Any Type of activity he gets short winded Patient uses Ventolin  inhaler however does help a little Breztri  has helped however patient is unable to afford it therefore will prescribe Advair HFA  With extensive smoking history patient is a candidate for lung cancer screening program Ambulatory pulse ox in the office did not reveal hypoxia  Assessment of OSA Home sleep test consistent with severe sleep apnea with AHI of 35 Patient refuses any type of therapy at this time All options relayed to patient  Patient currently weighs 265 pounds Weight loss goal 255  CAD/cardiac history 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 He denies chest pain, but has DOE and generalized fatigue. This may be multifactorial given thyroid  issues, general deconditioning, obesity (with reported weight gain), HFpEF and possible lung component. Recent echo showed LVEF 55-60%, mild LVH, normal RV, mild MR, dilation of ascending aorta measuring 42mm. Heart monitor is pending.  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      PAST MEDICAL HISTORY :   has a past medical history of Anxiety (09/07/2006), Arthritis, GERD (gastroesophageal reflux disease), High cholesterol, Hypertension (02/23/1996), Meniere's disease, and Thyroid  disease (10/15/2021).  has a past surgical history that includes Vasectomy; Hernia repair; and LEFT HEART CATH AND CORONARY ANGIOGRAPHY (N/A, 01/10/2022). Prior to Admission medications   Medication Sig Start Date End Date Taking? Authorizing Provider  albuterol  (VENTOLIN  HFA) 108 (90 Base) MCG/ACT inhaler Inhale 1 puff into the lungs every 6 (six) hours as needed for wheezing or shortness of breath.    [provider]  amLODipine  (NORVASC ) 5 MG tablet Take 1 tablet (5 mg total) by mouth 2 (two) times daily. 06/26/23   Wallace Joesph LABOR, PA  aspirin  EC 81 MG tablet Take 81 mg by mouth daily. Swallow whole.    [provider]  guaiFENesin  (MUCINEX ) 600 MG 12 hr tablet Take 1 tablet (600 mg total) by mouth 2 (two) times daily. 06/29/23   Wallace Joesph LABOR, PA  hydrochlorothiazide  (HYDRODIURIL ) 25 MG tablet Take 1 tablet (25 mg total) by mouth daily. 06/15/23 09/13/23  Furth, Cadence H, PA-C  lisinopril  (ZESTRIL ) 40 MG tablet Take 1 tablet (40 mg total) by mouth daily. 06/26/23   Wallace Joesph LABOR, PA  metoprolol  tartrate (LOPRESSOR ) 50 MG tablet Take 0.5 tablets (25 mg total) by mouth 2 (two) times daily. 04/10/23   Wallace Joesph LABOR, PA  rosuvastatin  (CRESTOR ) 10 MG tablet TAKE ONE TABLET BY MOUTH  ONE TIME DAILY 11/09/23   Chandra Toribio POUR, MD  SYNTHROID  100 MCG tablet Take 1 tablet (100 mcg total) by mouth daily before breakfast. 07/06/23   Therisa Benton PARAS, NP  traZODone  (DESYREL ) 50 MG tablet Take 0.5 tablets (25 mg total) by mouth at bedtime as  needed for sleep. 06/01/23   Wallace Joesph LABOR, PA   No Known Allergies  FAMILY HISTORY:  family history includes Cancer in his father and mother; Hypertension in his mother; Kidney disease in his mother; Varicose Veins in his mother. SOCIAL HISTORY:  reports that he quit smoking about 7 years ago. His smoking use included cigarettes. He started smoking about 40 years ago. He has a 33 pack-year smoking history. He has never been exposed to tobacco smoke. He has never used smokeless tobacco. He reports that he does not currently use alcohol. He reports current drug use. Drug: Marijuana.  BP 106/70   Pulse (!) 54   Temp 98.7 F (37.1 C)   Ht 5' 8 (1.727 m)   Wt 265 lb 3.2 oz (120.3 kg)   SpO2 98%   BMI 40.32 kg/m      Review of Systems: Gen:  Denies  fever, sweats, chills weight loss  HEENT: Denies blurred vision, double vision, ear pain, eye pain, hearing loss, nose bleeds, sore throat Cardiac:  No dizziness, chest pain or heaviness, chest tightness,edema, No JVD Resp:   No cough, -sputum production, -shortness of breath,-wheezing, -hemoptysis,  Other:  All other systems negative   Physical Examination:   General Appearance: No distress  EYES PERRLA, EOM intact.   NECK Supple, No JVD Pulmonary: normal breath sounds, No wheezing.  CardiovascularNormal S1,S2.  No m/r/g.   Abdomen: Benign, Soft, non-tender. Neurology UE/LE 5/5 strength, no focal deficits Ext pulses intact, cap refill intact ALL OTHER ROS ARE NEGATIVE       ASSESSMENT AND PLAN SYNOPSIS  63 yo morbidly obese male Patient with severe OSA AHI of 35  With underlying moderate COPD FEV1 73% predicted associated with obesity and deconditioned state   Assessment & Plan Chronic obstructive pulmonary disease, unspecified COPD type (HCC) Moderate COPD based on pulmonary function testing FEV1 72% predicted Patient could not afford Breztri  but it did help with symptoms therefore will prescribe Advair HFA Please  rinse mouth after use Recommend albuterol  as needed No exacerbation at this time No evidence of heart failure at this time No evidence or signs of infection at this time No respiratory distress No fevers, chills, nausea, vomiting, diarrhea No evidence of lower extremity edema No evidence hemoptysis Avoid Allergens and Irritants Avoid secondhand smoke Avoid SICK contacts Recommend  Masking  when appropriate Recommend Keep up-to-date with vaccinations  OSA (obstructive sleep apnea) Severe OSA with AHI of 35 Patient refuses all types of therapy at this time Risks and benefits explained to patient Screening for malignant neoplasm of respiratory organ Low-dose CT chest did not reveal any significant abnormalities Follow-up annually   Obesity -recommend significant weight loss -recommend changing diet Recommend weight loss goal of 255 pounds at next visit  Deconditioned state -Recommend increased daily activity and exercise   MEDICATION ADJUSTMENTS/LABS AND TESTS ORDERED: Advair HFA Rinse mouth after use Albuterol  as needed Follow-up lung cancer screening program Highly recommend weight loss Avoid Allergens and Irritants Avoid secondhand smoke Avoid SICK contacts Recommend  Masking  when appropriate Recommend Keep up-to-date with vaccinations    CURRENT MEDICATIONS REVIEWED AT LENGTH WITH PATIENT TODAY   Patient  satisfied with Plan of  action and management. All questions answered   Follow up 3 months   I spent a total of 48 minutes dedicated to the care of this patient on the date of this encounter to include pre-visit review of records, face-to-face time with the patient discussing conditions above, post visit ordering of testing, clinical documentation with the electronic health record, making appropriate referrals as documented, and communicating necessary information to the patient's healthcare team.    The Patient requires high complexity decision making for  assessment and support, frequent evaluation and titration of therapies, application of advanced monitoring technologies and extensive interpretation of multiple databases.  Patient satisfied with Plan of action and management. All questions answered    Nickolas Alm Cellar, M.D.  [facility]  Medical Director [facility] Medical Director [facility] Cardio-Pulmonary Department

## 2024-03-20 NOTE — Patient Instructions (Signed)
 Start ADVAIR 2 puffs in AM and 2 puffs in PM Please rinse mouth after every use  Use albuterol  as needed  Recommend weight loss Next weight goal is 255 pounds  Avoid Allergens and Irritants Avoid secondhand smoke Avoid SICK contacts Recommend  Masking  when appropriate Recommend Keep up-to-date with vaccinations

## 2024-03-20 NOTE — Patient Instructions (Signed)
 Full PFT completed today ? ?

## 2024-03-20 NOTE — Progress Notes (Signed)
 Full PFT completed today ? ?

## 2024-03-29 ENCOUNTER — Encounter: Payer: Self-pay | Admitting: Nurse Practitioner

## 2024-03-29 ENCOUNTER — Other Ambulatory Visit: Payer: Self-pay | Admitting: Family Medicine

## 2024-03-29 DIAGNOSIS — E890 Postprocedural hypothyroidism: Secondary | ICD-10-CM

## 2024-03-29 DIAGNOSIS — I10 Essential (primary) hypertension: Secondary | ICD-10-CM

## 2024-03-29 MED ORDER — LEVOTHYROXINE SODIUM 100 MCG PO TABS: 100.0000 ug | ORAL_TABLET | Freq: Every day | ORAL | 0 refills | Status: DC

## 2024-04-02 ENCOUNTER — Encounter: Payer: Self-pay | Admitting: Family Medicine

## 2024-04-02 ENCOUNTER — Other Ambulatory Visit: Payer: Self-pay | Admitting: Family Medicine

## 2024-04-02 MED ORDER — ALBUTEROL SULFATE HFA 108 (90 BASE) MCG/ACT IN AERS: 1.0000 | INHALATION_SPRAY | Freq: Four times a day (QID) | RESPIRATORY_TRACT | 11 refills | Status: DC | PRN

## 2024-04-06 LAB — T4, FREE: Free T4: 1.52 ng/dL (ref 0.82–1.77)

## 2024-04-06 LAB — TSH: TSH: 5.2 u[IU]/mL — ABNORMAL HIGH (ref 0.450–4.500)

## 2024-04-16 NOTE — Patient Instructions (Signed)

## 2024-04-17 ENCOUNTER — Encounter: Payer: Self-pay | Admitting: Nurse Practitioner

## 2024-04-17 ENCOUNTER — Ambulatory Visit (INDEPENDENT_AMBULATORY_CARE_PROVIDER_SITE_OTHER): Admitting: Nurse Practitioner

## 2024-04-17 VITALS — BP 110/70 | Ht 68.0 in | Wt 267.2 lb

## 2024-04-17 DIAGNOSIS — E890 Postprocedural hypothyroidism: Secondary | ICD-10-CM | POA: Diagnosis not present

## 2024-04-17 MED ORDER — LEVOTHYROXINE SODIUM 100 MCG PO TABS: 100.0000 ug | ORAL_TABLET | Freq: Every day | ORAL | 1 refills | Status: DC
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
## 2024-05-15 ENCOUNTER — Other Ambulatory Visit: Payer: Self-pay | Admitting: Family Medicine

## 2024-05-15 DIAGNOSIS — E785 Hyperlipidemia, unspecified: Secondary | ICD-10-CM

## 2024-06-03 ENCOUNTER — Other Ambulatory Visit: Payer: Self-pay | Admitting: Family Medicine

## 2024-06-26 ENCOUNTER — Telehealth: Payer: Self-pay | Admitting: Family Medicine

## 2024-06-26 NOTE — Telephone Encounter (Unsigned)
 Copied from CRM #[id number]. Topic: Clinical - Medication Refill >> Jun 26, 2024  9:53 AM Nathanel BROCKS wrote: Medication: albuterol  (VENTOLIN  HFA) 108 (90 Base) MCG/ACT inhaler  Has the patient contacted their pharmacy? No   This is the patient's preferred pharmacy:  [facility] [address] - [place], [place] - [address] [facility] [address] [place] [address] Phone: [phone] Fax: [phone]  Is this the correct pharmacy for this prescription? Yes If no, delete pharmacy and type the correct one.   Has the prescription been filled recently? Yes  Is the patient out of the medication? Yes  Has the patient been seen for an appointment in the last year OR does the patient have an upcoming appointment? Yes  Can we respond through MyChart? Yes  Agent: Please be advised that Rx refills may take up to 3 business days. We ask that you follow-up with your pharmacy.

## 2024-06-27 MED ORDER — ALBUTEROL SULFATE HFA 108 (90 BASE) MCG/ACT IN AERS: 1.0000 | INHALATION_SPRAY | Freq: Four times a day (QID) | RESPIRATORY_TRACT | 11 refills | Status: AC | PRN

## 2024-07-01 ENCOUNTER — Other Ambulatory Visit: Payer: Self-pay | Admitting: Family Medicine

## 2024-07-01 DIAGNOSIS — I10 Essential (primary) hypertension: Secondary | ICD-10-CM

## 2024-07-02 ENCOUNTER — Ambulatory Visit: Admitting: Family Medicine

## 2024-07-03 ENCOUNTER — Ambulatory Visit: Admitting: Internal Medicine

## 2024-08-12 ENCOUNTER — Other Ambulatory Visit: Payer: Self-pay | Admitting: Medical

## 2024-08-12 DIAGNOSIS — I10 Essential (primary) hypertension: Secondary | ICD-10-CM

## 2024-08-15 LAB — T4, FREE: Free T4: 1.33 ng/dL (ref 0.82–1.77)

## 2024-08-15 LAB — TSH: TSH: 5.62 u[IU]/mL — ABNORMAL HIGH (ref 0.450–4.500)

## 2024-08-16 ENCOUNTER — Encounter: Payer: Self-pay | Admitting: Family Medicine

## 2024-08-16 ENCOUNTER — Other Ambulatory Visit: Payer: Self-pay | Admitting: Family Medicine

## 2024-08-16 DIAGNOSIS — I10 Essential (primary) hypertension: Secondary | ICD-10-CM

## 2024-08-16 MED ORDER — HYDROCHLOROTHIAZIDE 25 MG PO TABS: 25.0000 mg | ORAL_TABLET | Freq: Every day | ORAL | 3 refills | Status: AC

## 2024-08-16 NOTE — Telephone Encounter (Signed)
Please contact pt for future appointment. Pt overdue for follow up.

## 2024-08-16 NOTE — Telephone Encounter (Signed)
 Patient being seen on 09/04/24

## 2024-08-20 ENCOUNTER — Ambulatory Visit: Admitting: Nurse Practitioner

## 2024-08-23 ENCOUNTER — Telehealth: Admitting: Nurse Practitioner

## 2024-08-23 ENCOUNTER — Encounter: Payer: Self-pay | Admitting: Nurse Practitioner

## 2024-08-23 VITALS — BP 110/82 | Ht 68.0 in | Wt 260.0 lb

## 2024-08-23 DIAGNOSIS — E890 Postprocedural hypothyroidism: Secondary | ICD-10-CM

## 2024-08-23 MED ORDER — LEVOTHYROXINE SODIUM 112 MCG PO TABS: 112.0000 ug | ORAL_TABLET | Freq: Every day | ORAL | 1 refills | Status: AC

## 2024-08-23 NOTE — Patient Instructions (Signed)

## 2024-08-26 ENCOUNTER — Ambulatory Visit: Admitting: Nurse Practitioner

## 2024-09-04 ENCOUNTER — Ambulatory Visit: Admitting: Cardiovascular Disease

## 2024-09-12 ENCOUNTER — Ambulatory Visit: Admitting: Family Medicine

## 2024-09-19 ENCOUNTER — Ambulatory Visit: Admitting: Internal Medicine

## 2024-10-21 ENCOUNTER — Telehealth: Admitting: Nurse Practitioner
